# Patient Record
Sex: Female | Born: 1965 | Race: Black or African American | Hispanic: No | Marital: Married | Smoking: Never smoker
Health system: Southern US, Community
[De-identification: ages and names within clinical notes are randomized; demographics above are authoritative.]

## PROBLEM LIST (undated history)

## (undated) DIAGNOSIS — G4733 Obstructive sleep apnea (adult) (pediatric): Secondary | ICD-10-CM

## (undated) DIAGNOSIS — F32A Depression, unspecified: Secondary | ICD-10-CM

## (undated) DIAGNOSIS — E785 Hyperlipidemia, unspecified: Secondary | ICD-10-CM

## (undated) DIAGNOSIS — Z9989 Dependence on other enabling machines and devices: Secondary | ICD-10-CM

## (undated) DIAGNOSIS — I10 Essential (primary) hypertension: Secondary | ICD-10-CM

## (undated) DIAGNOSIS — T7840XA Allergy, unspecified, initial encounter: Secondary | ICD-10-CM

## (undated) DIAGNOSIS — R0683 Snoring: Principal | ICD-10-CM

## (undated) DIAGNOSIS — G4730 Sleep apnea, unspecified: Secondary | ICD-10-CM

## (undated) HISTORY — DX: Snoring: R06.83

## (undated) HISTORY — DX: Allergy, unspecified, initial encounter: T78.40XA

## (undated) HISTORY — DX: Obstructive sleep apnea (adult) (pediatric): G47.33

## (undated) HISTORY — DX: Depression, unspecified: F32.A

## (undated) HISTORY — PX: OTHER SURGICAL HISTORY: SHX169

## (undated) HISTORY — PX: DIAGNOSTIC LAPAROSCOPY: SUR761

## (undated) HISTORY — DX: Dependence on other enabling machines and devices: Z99.89

## (undated) HISTORY — DX: Sleep apnea, unspecified: G47.30

## (undated) HISTORY — DX: Hyperlipidemia, unspecified: E78.5

---

## 1993-10-28 HISTORY — PX: CHOLECYSTECTOMY: SHX55

## 2002-09-29 ENCOUNTER — Encounter: Admission: RE | Admit: 2002-09-29 | Discharge: 2002-09-29 | Payer: Self-pay | Admitting: Internal Medicine

## 2002-09-29 ENCOUNTER — Encounter: Payer: Self-pay | Admitting: Internal Medicine

## 2002-11-02 ENCOUNTER — Encounter: Admission: RE | Admit: 2002-11-02 | Discharge: 2002-11-02 | Payer: Self-pay | Admitting: Obstetrics and Gynecology

## 2002-11-02 ENCOUNTER — Encounter: Payer: Self-pay | Admitting: Obstetrics and Gynecology

## 2005-04-25 ENCOUNTER — Emergency Department (HOSPITAL_COMMUNITY): Admission: EM | Admit: 2005-04-25 | Discharge: 2005-04-26 | Payer: Self-pay | Admitting: Emergency Medicine

## 2005-12-23 ENCOUNTER — Other Ambulatory Visit: Admission: RE | Admit: 2005-12-23 | Discharge: 2005-12-23 | Payer: Self-pay | Admitting: Obstetrics and Gynecology

## 2006-02-11 ENCOUNTER — Encounter: Admission: RE | Admit: 2006-02-11 | Discharge: 2006-02-11 | Payer: Self-pay | Admitting: Obstetrics and Gynecology

## 2007-02-13 ENCOUNTER — Encounter: Admission: RE | Admit: 2007-02-13 | Discharge: 2007-02-13 | Payer: Self-pay | Admitting: Obstetrics and Gynecology

## 2008-03-10 ENCOUNTER — Encounter: Admission: RE | Admit: 2008-03-10 | Discharge: 2008-03-10 | Payer: Self-pay | Admitting: Obstetrics and Gynecology

## 2009-03-20 ENCOUNTER — Encounter: Admission: RE | Admit: 2009-03-20 | Discharge: 2009-03-20 | Payer: Self-pay | Admitting: Obstetrics and Gynecology

## 2009-03-23 ENCOUNTER — Encounter: Admission: RE | Admit: 2009-03-23 | Discharge: 2009-03-23 | Payer: Self-pay | Admitting: Obstetrics and Gynecology

## 2009-09-05 ENCOUNTER — Encounter: Admission: RE | Admit: 2009-09-05 | Discharge: 2009-09-05 | Payer: Self-pay | Admitting: Obstetrics and Gynecology

## 2010-03-27 ENCOUNTER — Encounter: Admission: RE | Admit: 2010-03-27 | Discharge: 2010-03-27 | Payer: Self-pay | Admitting: Obstetrics and Gynecology

## 2010-11-18 ENCOUNTER — Encounter: Payer: Self-pay | Admitting: Obstetrics and Gynecology

## 2011-05-07 ENCOUNTER — Other Ambulatory Visit: Payer: Self-pay | Admitting: Obstetrics and Gynecology

## 2011-05-07 DIAGNOSIS — Z1231 Encounter for screening mammogram for malignant neoplasm of breast: Secondary | ICD-10-CM

## 2011-05-14 ENCOUNTER — Ambulatory Visit
Admission: RE | Admit: 2011-05-14 | Discharge: 2011-05-14 | Disposition: A | Discharge status: Home / Self Care | Payer: BC Managed Care – PPO | Source: Ambulatory Visit | Attending: Obstetrics and Gynecology | Admitting: Obstetrics and Gynecology

## 2011-05-14 DIAGNOSIS — Z1231 Encounter for screening mammogram for malignant neoplasm of breast: Secondary | ICD-10-CM

## 2012-05-08 ENCOUNTER — Other Ambulatory Visit: Payer: Self-pay | Admitting: Obstetrics and Gynecology

## 2012-05-08 DIAGNOSIS — Z1231 Encounter for screening mammogram for malignant neoplasm of breast: Secondary | ICD-10-CM

## 2012-05-25 ENCOUNTER — Ambulatory Visit
Admission: RE | Admit: 2012-05-25 | Discharge: 2012-05-25 | Disposition: A | Discharge status: Home / Self Care | Payer: Managed Care, Other (non HMO) | Source: Ambulatory Visit | Attending: Obstetrics and Gynecology | Admitting: Obstetrics and Gynecology

## 2012-05-25 DIAGNOSIS — Z1231 Encounter for screening mammogram for malignant neoplasm of breast: Secondary | ICD-10-CM

## 2012-05-27 ENCOUNTER — Encounter (HOSPITAL_BASED_OUTPATIENT_CLINIC_OR_DEPARTMENT_OTHER): Payer: Self-pay | Admitting: *Deleted

## 2012-05-27 NOTE — Progress Notes (Signed)
To come in for bmet-ekg  

## 2012-05-28 ENCOUNTER — Other Ambulatory Visit: Payer: Self-pay | Admitting: Obstetrics and Gynecology

## 2012-05-28 ENCOUNTER — Encounter (HOSPITAL_BASED_OUTPATIENT_CLINIC_OR_DEPARTMENT_OTHER)
Admission: RE | Admit: 2012-05-28 | Discharge: 2012-05-28 | Disposition: A | Discharge status: Home / Self Care | Payer: Managed Care, Other (non HMO) | Source: Ambulatory Visit | Attending: Orthopedic Surgery | Admitting: Orthopedic Surgery

## 2012-05-28 DIAGNOSIS — R928 Other abnormal and inconclusive findings on diagnostic imaging of breast: Secondary | ICD-10-CM

## 2012-05-28 LAB — BASIC METABOLIC PANEL
BUN: 10 mg/dL (ref 6–23)
CO2: 30 mEq/L (ref 19–32)
Calcium: 9.3 mg/dL (ref 8.4–10.5)
Chloride: 101 mEq/L (ref 96–112)
Creatinine, Ser: 0.72 mg/dL (ref 0.50–1.10)
GFR calc Af Amer: >90 mL/min (ref >90)
GFR calc non Af Amer: >90 mL/min (ref >90)

## 2012-05-28 NOTE — Progress Notes (Signed)
ekg cleared by dr Jean Rosenthal compare with old ekg no change

## 2012-05-29 ENCOUNTER — Other Ambulatory Visit: Payer: Self-pay | Admitting: Orthopedic Surgery

## 2012-06-02 ENCOUNTER — Encounter (HOSPITAL_BASED_OUTPATIENT_CLINIC_OR_DEPARTMENT_OTHER)
Admission: RE | Disposition: A | Discharge status: Home / Self Care | Payer: Self-pay | Source: Ambulatory Visit | Attending: Orthopedic Surgery

## 2012-06-02 ENCOUNTER — Encounter (HOSPITAL_BASED_OUTPATIENT_CLINIC_OR_DEPARTMENT_OTHER): Payer: Self-pay | Admitting: Certified Registered Nurse Anesthetist

## 2012-06-02 ENCOUNTER — Encounter (HOSPITAL_BASED_OUTPATIENT_CLINIC_OR_DEPARTMENT_OTHER): Payer: Self-pay | Admitting: Orthopedic Surgery

## 2012-06-02 ENCOUNTER — Ambulatory Visit (HOSPITAL_BASED_OUTPATIENT_CLINIC_OR_DEPARTMENT_OTHER): Payer: Managed Care, Other (non HMO) | Admitting: Certified Registered Nurse Anesthetist

## 2012-06-02 ENCOUNTER — Ambulatory Visit (HOSPITAL_BASED_OUTPATIENT_CLINIC_OR_DEPARTMENT_OTHER)
Admission: RE | Admit: 2012-06-02 | Discharge: 2012-06-02 | Disposition: A | Discharge status: Home / Self Care | Payer: Managed Care, Other (non HMO) | Source: Ambulatory Visit | Attending: Orthopedic Surgery | Admitting: Orthopedic Surgery

## 2012-06-02 ENCOUNTER — Encounter (HOSPITAL_BASED_OUTPATIENT_CLINIC_OR_DEPARTMENT_OTHER): Payer: Self-pay

## 2012-06-02 DIAGNOSIS — Z01810 Encounter for preprocedural cardiovascular examination: Secondary | ICD-10-CM | POA: Insufficient documentation

## 2012-06-02 DIAGNOSIS — R229 Localized swelling, mass and lump, unspecified: Secondary | ICD-10-CM | POA: Insufficient documentation

## 2012-06-02 DIAGNOSIS — I10 Essential (primary) hypertension: Secondary | ICD-10-CM | POA: Insufficient documentation

## 2012-06-02 DIAGNOSIS — Z01812 Encounter for preprocedural laboratory examination: Secondary | ICD-10-CM | POA: Insufficient documentation

## 2012-06-02 HISTORY — PX: MASS EXCISION: SHX2000

## 2012-06-02 HISTORY — DX: Essential (primary) hypertension: I10

## 2012-06-02 LAB — POCT HEMOGLOBIN-HEMACUE: Hemoglobin: 13.8 g/dL (ref 12.0–15.0)

## 2012-06-02 SURGERY — EXCISION MASS
Anesthesia: Monitor Anesthesia Care | Site: Thumb | Laterality: Left | Wound class: Clean

## 2012-06-02 MED ORDER — CEFAZOLIN SODIUM 1-5 GM-% IV SOLN
INTRAVENOUS | Status: DC | PRN
  Administered 2012-06-02: 2 g via INTRAVENOUS

## 2012-06-02 MED ORDER — LIDOCAINE HCL (CARDIAC) 20 MG/ML IV SOLN
INTRAVENOUS | Status: DC | PRN
  Administered 2012-06-02: 30 mg via INTRAVENOUS

## 2012-06-02 MED ORDER — DEXAMETHASONE SODIUM PHOSPHATE 10 MG/ML IJ SOLN
INTRAMUSCULAR | Status: DC | PRN
  Administered 2012-06-02: 4 mg via INTRAVENOUS

## 2012-06-02 MED ORDER — HYDROCODONE-ACETAMINOPHEN 5-500 MG PO TABS: 1.0000 | ORAL_TABLET | ORAL | Status: AC | PRN

## 2012-06-02 MED ORDER — HYDROMORPHONE HCL PF 1 MG/ML IJ SOLN: 0.2500 mg | INTRAMUSCULAR | Status: DC | PRN

## 2012-06-02 MED ORDER — OXYCODONE HCL 5 MG/5ML PO SOLN: 5.0000 mg | Freq: Once | ORAL | Status: DC | PRN

## 2012-06-02 MED ORDER — METOCLOPRAMIDE HCL 5 MG/ML IJ SOLN: 10.0000 mg | Freq: Once | INTRAMUSCULAR | Status: DC | PRN

## 2012-06-02 MED ORDER — CHLORHEXIDINE GLUCONATE 4 % EX LIQD: 60.0000 mL | Freq: Once | CUTANEOUS | Status: DC

## 2012-06-02 MED ORDER — OXYCODONE HCL 5 MG PO TABS: 5.0000 mg | ORAL_TABLET | Freq: Once | ORAL | Status: DC | PRN

## 2012-06-02 MED ORDER — PROPOFOL 10 MG/ML IV EMUL
INTRAVENOUS | Status: DC | PRN
  Administered 2012-06-02: 75 ug/kg/min via INTRAVENOUS

## 2012-06-02 MED ORDER — LIDOCAINE HCL (PF) 0.5 % IJ SOLN
INTRAMUSCULAR | Status: DC | PRN
  Administered 2012-06-02: 30 mL via INTRATHECAL

## 2012-06-02 MED ORDER — BUPIVACAINE HCL (PF) 0.25 % IJ SOLN
INTRAMUSCULAR | Status: DC | PRN
  Administered 2012-06-02: 5 mL

## 2012-06-02 MED ORDER — LACTATED RINGERS IV SOLN
INTRAVENOUS | Status: DC
  Administered 2012-06-02: 07:00:00 via INTRAVENOUS

## 2012-06-02 MED ORDER — MIDAZOLAM HCL 5 MG/5ML IJ SOLN
INTRAMUSCULAR | Status: DC | PRN
  Administered 2012-06-02: 1 mg via INTRAVENOUS

## 2012-06-02 MED ORDER — FENTANYL CITRATE 0.05 MG/ML IJ SOLN
INTRAMUSCULAR | Status: DC | PRN
  Administered 2012-06-02: 50 ug via INTRAVENOUS

## 2012-06-02 MED ORDER — 0.9 % SODIUM CHLORIDE (POUR BTL) OPTIME
TOPICAL | Status: DC | PRN
  Administered 2012-06-02: 1000 mL

## 2012-06-02 SURGICAL SUPPLY — 49 items
BANDAGE COBAN STERILE 2 (GAUZE/BANDAGES/DRESSINGS) IMPLANT
BANDAGE GAUZE ELAST BULKY 4 IN (GAUZE/BANDAGES/DRESSINGS) IMPLANT
BLADE MINI RND TIP GREEN BEAV (BLADE) IMPLANT
BLADE SURG 15 STRL LF DISP TIS (BLADE) ×1 IMPLANT
BLADE SURG 15 STRL SS (BLADE) ×2
BNDG CMPR 9X4 STRL LF SNTH (GAUZE/BANDAGES/DRESSINGS) ×1
BNDG COHESIVE 1X5 TAN STRL LF (GAUZE/BANDAGES/DRESSINGS) ×1 IMPLANT
BNDG COHESIVE 3X5 TAN STRL LF (GAUZE/BANDAGES/DRESSINGS) IMPLANT
BNDG ESMARK 4X9 LF (GAUZE/BANDAGES/DRESSINGS) ×1 IMPLANT
CHLORAPREP W/TINT 26ML (MISCELLANEOUS) ×2 IMPLANT
CLOTH BEACON ORANGE TIMEOUT ST (SAFETY) ×2 IMPLANT
CORDS BIPOLAR (ELECTRODE) ×2 IMPLANT
COVER MAYO STAND STRL (DRAPES) ×2 IMPLANT
COVER TABLE BACK 60X90 (DRAPES) ×2 IMPLANT
CUFF TOURNIQUET SINGLE 18IN (TOURNIQUET CUFF) ×1 IMPLANT
DECANTER SPIKE VIAL GLASS SM (MISCELLANEOUS) IMPLANT
DRAIN PENROSE 1/2X12 LTX STRL (WOUND CARE) IMPLANT
DRAPE EXTREMITY T 121X128X90 (DRAPE) ×2 IMPLANT
DRAPE SURG 17X23 STRL (DRAPES) ×2 IMPLANT
GAUZE XEROFORM 1X8 LF (GAUZE/BANDAGES/DRESSINGS) ×2 IMPLANT
GLOVE BIO SURGEON STRL SZ 6.5 (GLOVE) ×2 IMPLANT
GLOVE BIOGEL PI IND STRL 7.0 (GLOVE) IMPLANT
GLOVE BIOGEL PI INDICATOR 7.0 (GLOVE) ×1
GLOVE SURG ORTHO 8.0 STRL STRW (GLOVE) ×2 IMPLANT
GOWN BRE IMP PREV XXLGXLNG (GOWN DISPOSABLE) ×3 IMPLANT
GOWN PREVENTION PLUS XLARGE (GOWN DISPOSABLE) ×2 IMPLANT
NDL SAFETY ECLIPSE 18X1.5 (NEEDLE) ×1 IMPLANT
NEEDLE 27GAX1X1/2 (NEEDLE) ×1 IMPLANT
NEEDLE HYPO 18GX1.5 SHARP (NEEDLE)
NS IRRIG 1000ML POUR BTL (IV SOLUTION) ×2 IMPLANT
PACK BASIN DAY SURGERY FS (CUSTOM PROCEDURE TRAY) ×2 IMPLANT
PAD CAST 3X4 CTTN HI CHSV (CAST SUPPLIES) IMPLANT
PADDING CAST ABS 3INX4YD NS (CAST SUPPLIES)
PADDING CAST ABS 4INX4YD NS (CAST SUPPLIES) ×1
PADDING CAST ABS COTTON 3X4 (CAST SUPPLIES) IMPLANT
PADDING CAST ABS COTTON 4X4 ST (CAST SUPPLIES) ×1 IMPLANT
PADDING CAST COTTON 3X4 STRL (CAST SUPPLIES)
SPLINT PLASTER CAST XFAST 3X15 (CAST SUPPLIES) IMPLANT
SPLINT PLASTER XTRA FASTSET 3X (CAST SUPPLIES)
SPONGE GAUZE 4X4 12PLY (GAUZE/BANDAGES/DRESSINGS) ×2 IMPLANT
STOCKINETTE 4X48 STRL (DRAPES) ×2 IMPLANT
SUT VIC AB 4-0 P2 18 (SUTURE) IMPLANT
SUT VICRYL RAPID 5 0 P 3 (SUTURE) IMPLANT
SUT VICRYL RAPIDE 4/0 PS 2 (SUTURE) ×2 IMPLANT
SYR BULB 3OZ (MISCELLANEOUS) ×2 IMPLANT
SYR CONTROL 10ML LL (SYRINGE) ×1 IMPLANT
TOWEL OR 17X24 6PK STRL BLUE (TOWEL DISPOSABLE) ×3 IMPLANT
UNDERPAD 30X30 INCONTINENT (UNDERPADS AND DIAPERS) ×2 IMPLANT
WATER STERILE IRR 1000ML POUR (IV SOLUTION) ×1 IMPLANT

## 2012-06-02 NOTE — Brief Op Note (Signed)
06/02/2012  9:11 AM  PATIENT:  Marie Rocha  46 y.o. female  PRE-OPERATIVE DIAGNOSIS:  Mass left thumb  POST-OPERATIVE DIAGNOSIS:  Mass left thumb  PROCEDURE:  Procedure(s) (LRB): EXCISION MASS (Left)  SURGEON:  Surgeon(s) and Role:    * Nicki Reaper, MD - Primary  PHYSICIAN ASSISTANT:   ASSISTANTS: none   ANESTHESIA:   local and regional  EBL:     BLOOD ADMINISTERED:none  DRAINS: none   LOCAL MEDICATIONS USED:  MARCAINE     SPECIMEN:  Excision  DISPOSITION OF SPECIMEN:  PATHOLOGY  COUNTS:  YES  TOURNIQUET:   Total Tourniquet Time Documented: Forearm (Left) - 19 minutes  DICTATION: .Other Dictation: Dictation Number [id number]  PLAN OF CARE: Discharge to home after PACU  PATIENT DISPOSITION:  PACU - hemodynamically stable.

## 2012-06-02 NOTE — H&P (Signed)
  [name] is a 46 year-old right-hand dominant female referred by Dr. Eloise Harman for consultation with respect to a mass on her left thumb radial aspect left side just distal to the metacarpophalangeal joint. She recalls no specific history of injury.  It does not bother her at the present time.  She complains only of a mild dull feeling of swelling. She states it has gotten somewhat better.  She has no history of diabetes, thyroid problems, arthritis or gout.    ALLERGIES:    None.  MEDICATIONS:      Amlodipine.  SURGICAL HISTORY:      Cholecystectomy.  FAMILY MEDICAL HISTORY:  Positive for high blood pressure, otherwise negative.   SOCIAL HISTORY:     She does not smoke or drink.   REVIEW OF SYSTEMS:    Positive for glasses, high blood pressure, otherwise negative 14 points. Marie Rocha is an 46 y.o. female.   Chief Complaint: mass left thumb HPI: see above  Past Medical History  Diagnosis Date  . Hypertension     Past Surgical History  Procedure Date  . Cholecystectomy 1995    lap choli  . Diagnostic laparoscopy     exploratory-    History reviewed. No pertinent family history. Social History:  reports that she has never smoked. She does not have any smokeless tobacco history on file. Her alcohol and drug histories not on file.  Allergies: No Known Allergies  Medications Prior to Admission  Medication Sig Dispense Refill  . amLODipine (NORVASC) 5 MG tablet Take 5 mg by mouth daily.        Results for orders placed during the hospital encounter of 06/02/12 (from the past 48 hour(s))  POCT HEMOGLOBIN-HEMACUE     Status: Normal   Collection Time   06/02/12  7:29 AM      Component Value Range Comment   Hemoglobin 13.8  12.0 - 15.0 g/dL     No results found.   Pertinent items are noted in HPI.  Blood pressure 127/86, pulse 63, temperature 97.9 F (36.6 C), temperature source Oral, resp. rate 18, height 5\' 7"  (1.702 m), weight 225 lb (102.059 kg), SpO2  97.00%.  General appearance: alert, cooperative and appears stated age Head: Normocephalic, without obvious abnormality Neck: no adenopathy Resp: clear to auscultation bilaterally Cardio: regular rate and rhythm, S1, S2 normal, no murmur, click, rub or gallop GI: soft, non-tender; bowel sounds normal; no masses,  no organomegaly Extremities: extremities normal, atraumatic, no cyanosis or edema Pulses: 2+ and symmetric Skin: Skin color, texture, turgor normal. No rashes or lesions Neurologic: Grossly normal Incision/Wound: na  Assessment/Plan DIAGNOSIS:      Soft tissue tumor unspecified.    RECOMMENDATIONS/PLAN:    This may be a giant cell tumor, foreign body granuloma, epidermal inclusion cyst, could be a history of gout.  There is always potential this could be a vascular tumor although it is nonpulsatile.  There is possibility a nerve tumor also exists despite this having a negative Tinel's.  We have discussed possibility of surgical excision of this. She is desirous of having this done.  She is scheduled for excision mass left thumb as an outpatient.    Jenniferann Stuckert R 06/02/2012, 8:34 AM

## 2012-06-02 NOTE — Op Note (Signed)
Dictated number:[id number]

## 2012-06-02 NOTE — Transfer of Care (Signed)
Immediate Anesthesia Transfer of Care Note  Patient: Marie Rocha  Procedure(s) Performed: Procedure(s) (LRB): EXCISION MASS (Left)  Patient Location: PACU  Anesthesia Type: Regional  Level of Consciousness: awake and alert   Airway & Oxygen Therapy: Patient Spontanous Breathing and Patient connected to face mask oxygen  Post-op Assessment: Report given to PACU RN and Post -op Vital signs reviewed and stable  Post vital signs: Reviewed and stable  Complications: No apparent anesthesia complications

## 2012-06-02 NOTE — Anesthesia Preprocedure Evaluation (Signed)
Anesthesia Evaluation  Patient identified by MRN, date of birth, ID band Patient awake    Reviewed: Allergy & Precautions, H&P , NPO status , Patient's Chart, lab work & pertinent test results, reviewed documented beta blocker date and time   Airway Mallampati: II TM Distance: >3 FB Neck ROM: full    Dental   Pulmonary neg pulmonary ROS,  breath sounds clear to auscultation        Cardiovascular hypertension, On Medications Rhythm:regular     Neuro/Psych negative neurological ROS  negative psych ROS   GI/Hepatic negative GI ROS, Neg liver ROS,   Endo/Other  negative endocrine ROS  Renal/GU negative Renal ROS  negative genitourinary   Musculoskeletal   Abdominal   Peds  Hematology negative hematology ROS (+)   Anesthesia Other Findings See surgeon's H&P   Reproductive/Obstetrics negative OB ROS                           Anesthesia Physical Anesthesia Plan  ASA: III  Anesthesia Plan: MAC and Bier Block   Post-op Pain Management:    Induction: Intravenous  Airway Management Planned: Simple Face Mask  Additional Equipment:   Intra-op Plan:   Post-operative Plan:   Informed Consent: I have reviewed the patients History and Physical, chart, labs and discussed the procedure including the risks, benefits and alternatives for the proposed anesthesia with the patient or authorized representative who has indicated his/her understanding and acceptance.   Dental Advisory Given  Plan Discussed with: CRNA and Surgeon  Anesthesia Plan Comments:         Anesthesia Quick Evaluation

## 2012-06-02 NOTE — Anesthesia Procedure Notes (Signed)
Procedure Name: MAC Date/Time: 06/02/2012 8:41 AM Performed by: Caleesi Kohl D Pre-anesthesia Checklist: Patient identified, Emergency Drugs available, Suction available, Patient being monitored and Timeout performed Patient Re-evaluated:Patient Re-evaluated prior to inductionOxygen Delivery Method: Simple face mask

## 2012-06-02 NOTE — Anesthesia Postprocedure Evaluation (Signed)
Anesthesia Post Note  Patient: [name]  Procedure(s) Performed: Procedure(s) (LRB): EXCISION MASS (Left)  Anesthesia type: MAC  Patient location: PACU  Post pain: Pain level controlled  Post assessment: Patient's Cardiovascular Status Stable  Last Vitals:  Filed Vitals:   06/02/12 0940  BP: 149/85  Pulse: 54  Temp: 36.6 C  Resp: 16    Post vital signs: Reviewed and stable  Level of consciousness: alert  Complications: No apparent anesthesia complications

## 2012-06-03 ENCOUNTER — Encounter (HOSPITAL_BASED_OUTPATIENT_CLINIC_OR_DEPARTMENT_OTHER): Payer: Self-pay | Admitting: Orthopedic Surgery

## 2012-06-03 NOTE — Op Note (Signed)
NAMEZACHARY, Marie Rocha.:  [id number]  MEDICAL RECORD NO.:  [id number]  LOCATION:                                 FACILITY:  PHYSICIAN:  Cindee Salt, M.D.            DATE OF BIRTH:  DATE OF PROCEDURE:  06/02/2012 DATE OF DISCHARGE:                              OPERATIVE REPORT   PREOPERATIVE DIAGNOSIS:  Mass left thumb.  POSTOPERATIVE DIAGNOSIS:  Mass left thumb.  OPERATION:  Excisional biopsy mass left thumb.  SURGEON:  Cindee Salt, MD  ANESTHESIA:  Forearm-based IV regional with local infiltration.  ANESTHESIOLOGIST:  Janetta Hora. Gelene Mink, MD  HISTORY:  The patient is a 46 year old female with history of a mass on the thenar eminence of her left thumb.  This has enlarged.  She is concerned.  She is desirous having it removed.  She is aware that there is no guarantee with the surgery, possibility of infection, recurrence of injury to arteries, nerves, tendons, incomplete relief of symptoms, dystrophy.  It is difficult to determine exactly what this is and that it does not transilluminate and appears to be solid.  In the preoperative area, the patient is seen, the extremity marked by both the patient and surgeon.  PROCEDURE:  The patient was brought to the operating room where a forearm-based IV regional anesthetic was carried out without difficulty. She was prepped using ChloraPrep, supine position, left arm free.  A 3- minute dry time was allowed.  Time-out taken, confirming the patient and procedure.  After adequate anesthesia was afforded, a longitudinal incision was made.  This was over the interval between the dorsal palmar, skin carried down through subcutaneous tissue.  The mass was immediately encountered.  This was multilobulated.  A purplish grey solid mass.  Neurovascular structures were identified, protected, blood vessels entering into the mass were coagulated with bipolar, and the entire mass was excised in total and sent to Pathology.   This was well encapsulated, was firm the wound was copiously irrigated with saline. The skin was then closed with subcuticular 4-0 Vicryl Rapide suture and local infiltration with 0.25% Marcaine without epinephrine was given, approximately 5 mL was used.  Sterile compressive dressing was applied. Deflation of the tourniquet, all fingers immediately pinked.  She was taken to the recovery room for observation in satisfactory condition.         ______________________________ Cindee Salt, M.D.    GK/MEDQ  D:  06/02/2012  T:  06/03/2012  Job:  [id number]

## 2012-06-04 ENCOUNTER — Ambulatory Visit
Admission: RE | Admit: 2012-06-04 | Discharge: 2012-06-04 | Disposition: A | Discharge status: Home / Self Care | Payer: Managed Care, Other (non HMO) | Source: Ambulatory Visit | Attending: Obstetrics and Gynecology | Admitting: Obstetrics and Gynecology

## 2012-06-04 ENCOUNTER — Other Ambulatory Visit: Payer: Self-pay | Admitting: Obstetrics and Gynecology

## 2012-06-04 DIAGNOSIS — R928 Other abnormal and inconclusive findings on diagnostic imaging of breast: Secondary | ICD-10-CM

## 2012-06-11 ENCOUNTER — Inpatient Hospital Stay: Admission: RE | Admit: 2012-06-11 | Payer: Managed Care, Other (non HMO) | Source: Ambulatory Visit

## 2012-06-24 ENCOUNTER — Other Ambulatory Visit: Payer: Self-pay | Admitting: Obstetrics and Gynecology

## 2012-06-24 ENCOUNTER — Ambulatory Visit
Admission: RE | Admit: 2012-06-24 | Discharge: 2012-06-24 | Disposition: A | Discharge status: Home / Self Care | Payer: Managed Care, Other (non HMO) | Source: Ambulatory Visit | Attending: Obstetrics and Gynecology | Admitting: Obstetrics and Gynecology

## 2012-06-24 DIAGNOSIS — R928 Other abnormal and inconclusive findings on diagnostic imaging of breast: Secondary | ICD-10-CM

## 2012-12-19 IMAGING — MG MM DIAGNOSTIC UNILATERAL R
2 series · 2 of 2 positions shown · non-contrast
Comparison: Previous exams.

CLINICAL DATA: Ultrasound-guided core needle biopsy of a nodule at
3 o'clock 2 cm from the right nipple with clip placement.

DIGITAL DIAGNOSTIC RIGHT MAMMOGRAM

[R CC]
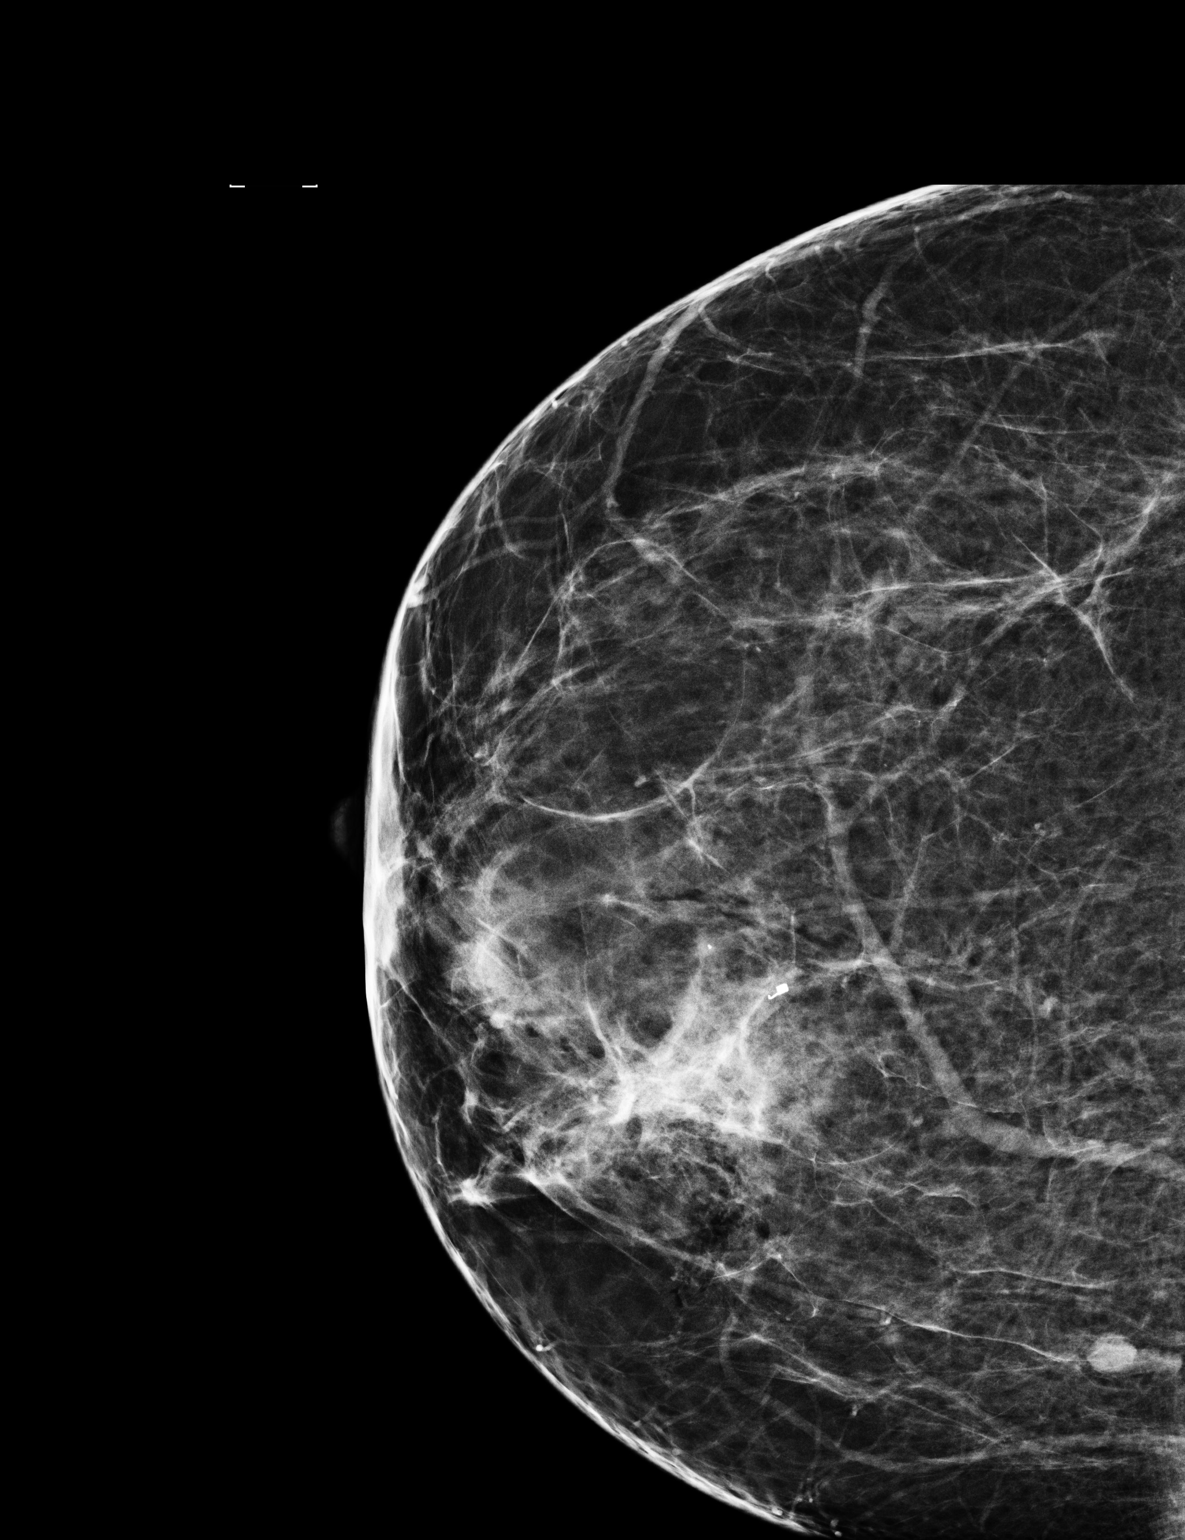

[R ML]
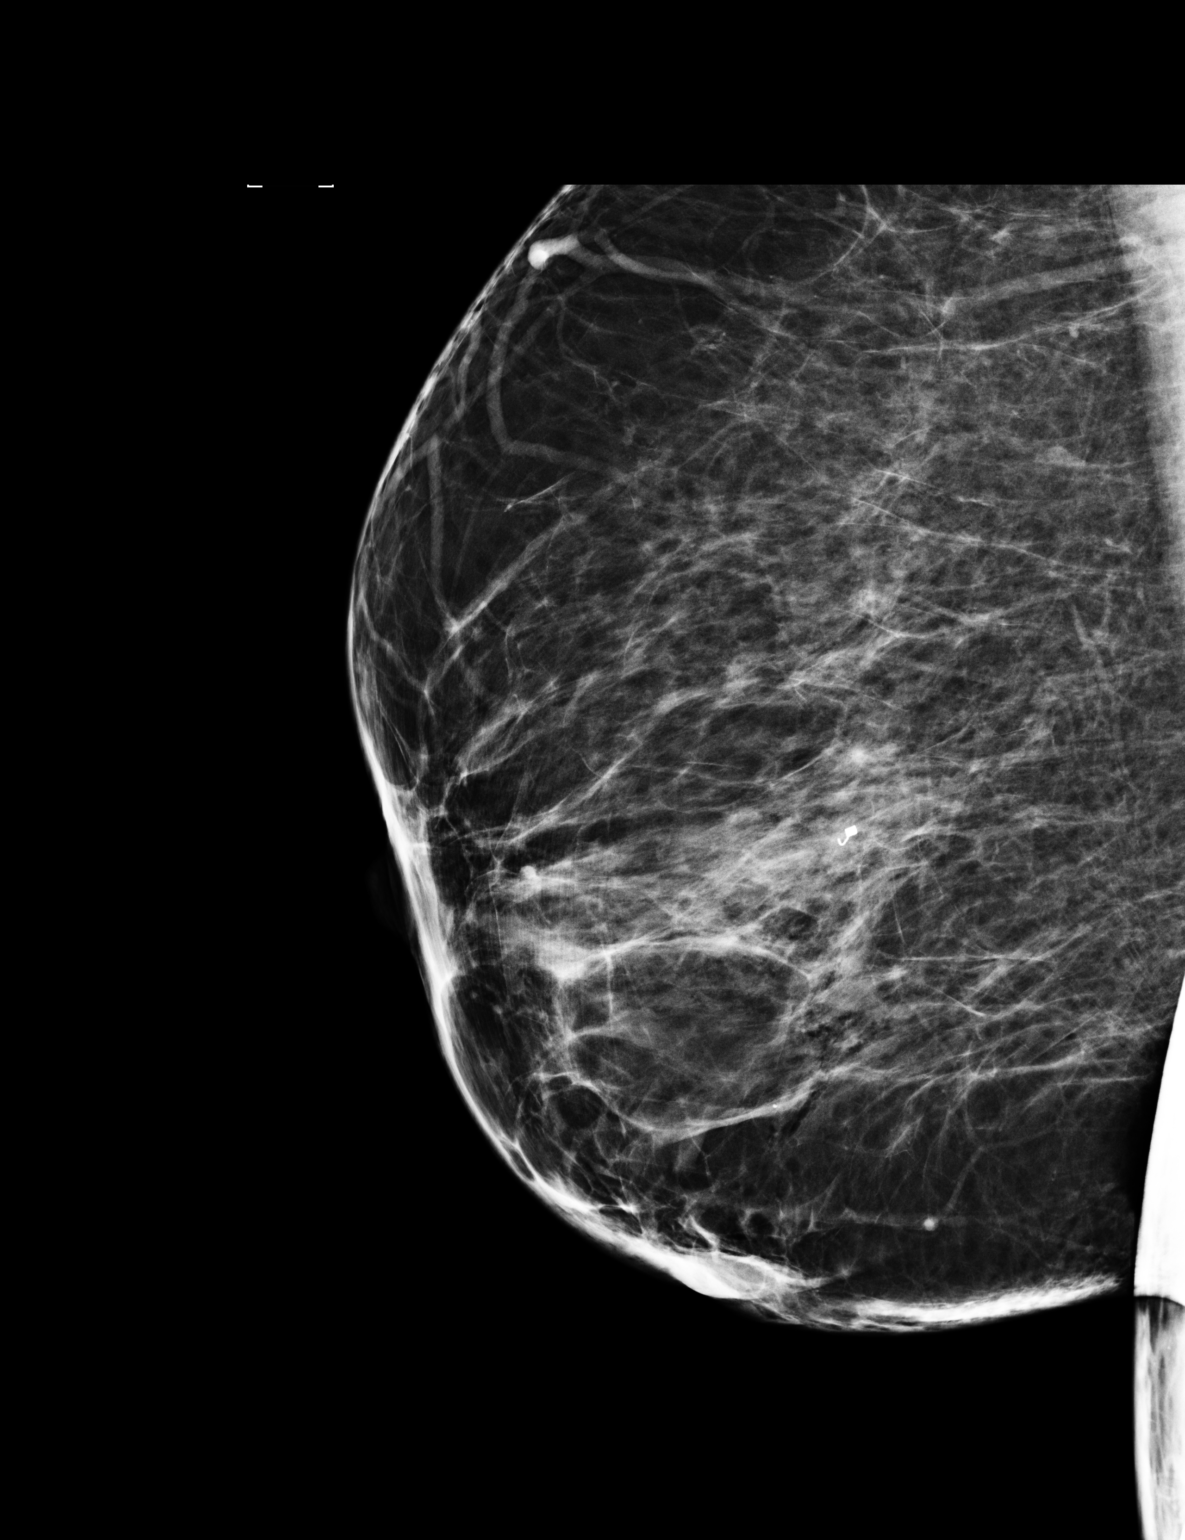

[2 of 2 positions shown; findings below may reference images not displayed]

FINDINGS: Films are performed following ultrasound guided biopsy
of a nodule at 3 o'clock 2 cm from the right nipple.  The coil clip
is approximately 12 mm medial to the location of the mass in the CC
projection but appropriately located in the MLO projection.
IMPRESSION: Clip placement as above.

## 2013-05-12 ENCOUNTER — Other Ambulatory Visit: Payer: Self-pay

## 2013-05-12 DIAGNOSIS — Z1231 Encounter for screening mammogram for malignant neoplasm of breast: Secondary | ICD-10-CM

## 2013-05-28 ENCOUNTER — Ambulatory Visit
Admission: RE | Admit: 2013-05-28 | Discharge: 2013-05-28 | Disposition: A | Discharge status: Home / Self Care | Payer: Managed Care, Other (non HMO) | Source: Ambulatory Visit

## 2013-05-28 DIAGNOSIS — Z1231 Encounter for screening mammogram for malignant neoplasm of breast: Secondary | ICD-10-CM

## 2013-06-01 ENCOUNTER — Other Ambulatory Visit: Payer: Self-pay | Admitting: Obstetrics and Gynecology

## 2013-06-01 DIAGNOSIS — R928 Other abnormal and inconclusive findings on diagnostic imaging of breast: Secondary | ICD-10-CM

## 2013-06-16 ENCOUNTER — Other Ambulatory Visit: Payer: Managed Care, Other (non HMO)

## 2013-07-02 ENCOUNTER — Ambulatory Visit
Admission: RE | Admit: 2013-07-02 | Discharge: 2013-07-02 | Disposition: A | Discharge status: Home / Self Care | Payer: Managed Care, Other (non HMO) | Source: Ambulatory Visit | Attending: Obstetrics and Gynecology | Admitting: Obstetrics and Gynecology

## 2013-07-02 DIAGNOSIS — R928 Other abnormal and inconclusive findings on diagnostic imaging of breast: Secondary | ICD-10-CM

## 2014-01-05 ENCOUNTER — Encounter: Payer: Self-pay | Admitting: Internal Medicine

## 2014-01-07 ENCOUNTER — Encounter: Payer: Self-pay | Admitting: Neurology

## 2014-02-04 ENCOUNTER — Institutional Professional Consult (permissible substitution): Payer: BC Managed Care – PPO | Admitting: Neurology

## 2014-02-22 ENCOUNTER — Encounter: Payer: Managed Care, Other (non HMO) | Admitting: Internal Medicine

## 2014-05-24 ENCOUNTER — Ambulatory Visit (AMBULATORY_SURGERY_CENTER): Payer: BC Managed Care – PPO | Admitting: *Deleted

## 2014-05-24 VITALS — Ht 67.5 in | Wt 239.8 lb

## 2014-05-24 DIAGNOSIS — Z1211 Encounter for screening for malignant neoplasm of colon: Secondary | ICD-10-CM

## 2014-05-24 MED ORDER — MOVIPREP 100 G PO SOLR: 1.0000 | Freq: Once | ORAL | Status: DC

## 2014-05-24 NOTE — Addendum Note (Signed)
Addended by: Dayton Bailiff D on: 05/24/2014 11:29 AM   Modules accepted: Orders, Medications

## 2014-05-24 NOTE — Progress Notes (Signed)
No egg or soy allergy. No anesthesia problems.  No home O2.   Diet medications Naltrexone-Bupropion.

## 2014-05-31 ENCOUNTER — Encounter: Payer: Self-pay | Admitting: Internal Medicine

## 2014-06-02 ENCOUNTER — Ambulatory Visit (AMBULATORY_SURGERY_CENTER): Payer: BC Managed Care – PPO | Admitting: Internal Medicine

## 2014-06-02 ENCOUNTER — Encounter: Payer: Self-pay | Admitting: Internal Medicine

## 2014-06-02 VITALS — BP 150/86 | HR 60 | Temp 98.3°F | Resp 14 | Ht 67.5 in | Wt 239.0 lb

## 2014-06-02 DIAGNOSIS — D126 Benign neoplasm of colon, unspecified: Secondary | ICD-10-CM

## 2014-06-02 DIAGNOSIS — D122 Benign neoplasm of ascending colon: Secondary | ICD-10-CM

## 2014-06-02 DIAGNOSIS — Z1211 Encounter for screening for malignant neoplasm of colon: Secondary | ICD-10-CM

## 2014-06-02 MED ORDER — SODIUM CHLORIDE 0.9 % IV SOLN: 500.0000 mL | INTRAVENOUS | Status: DC

## 2014-06-02 NOTE — Progress Notes (Signed)
Called to room to assist during endoscopic procedure.  Patient ID and intended procedure confirmed with present staff. Received instructions for my participation in the procedure from the performing physician.  

## 2014-06-02 NOTE — Progress Notes (Signed)
Report to PACU, RN, vss, BBS= Clear.  

## 2014-06-02 NOTE — Op Note (Signed)
[facility]  [address] [place], [id number]   COLONOSCOPY PROCEDURE REPORT  PATIENT: Marie Rocha, Marie Rocha.  MR#: [id number] BIRTHDATE: 08-06-1966 , 48  yrs. old GENDER: Female ENDOSCOPIST: Eustace Quail, MD REFERRED BY:W.  Lutricia Feil, M.D. PROCEDURE DATE:  06/02/2014 PROCEDURE:   Colonoscopy with snare polypectomy x 1 First Screening Colonoscopy - Avg.  risk and is 50 yrs.  old or older Yes.  Prior Negative Screening - Now for repeat screening. N/A  History of Adenoma - Now for follow-up colonoscopy & has been > or = to 3 yrs.  N/A  Polyps Removed Today? Yes. ASA CLASS:   Class II INDICATIONS:average risk screening. MEDICATIONS: MAC sedation, administered by CRNA and propofol (Diprivan) 380mg  IV  DESCRIPTION OF PROCEDURE:   After the risks benefits and alternatives of the procedure were thoroughly explained, informed consent was obtained.  A digital rectal exam revealed no abnormalities of the rectum.   The [id number]  endoscope was introduced through the anus and advanced to the cecum, which was identified by both the appendix and ileocecal valve. No adverse events experienced.   The quality of the prep was excellent, using MoviPrep  The instrument was then slowly withdrawn as the colon was fully examined.  COLON FINDINGS: A diminutive polyp was found in the ascending colon. A polypectomy was performed with a cold snare.  The resection was complete and the polyp tissue was completely retrieved.   The colon was otherwise normal.  There was no diverticulosis, inflammation, other polyps or cancers unless previously stated.  Retroflexed views revealed no abnormalities. The time to cecum=2 minutes 26 seconds.  Withdrawal time=13 minutes 46 seconds.  The scope was withdrawn and the procedure completed. COMPLICATIONS: There were no complications.  ENDOSCOPIC IMPRESSION: 1.   Diminutive polyp was found in the ascending colon; polypectomy was performed  with a cold snare 2.   The colon was otherwise normal  RECOMMENDATIONS: 1. Repeat colonoscopy in 5 years if polyp adenomatous; otherwise 10 years   eSigned:  Eustace Quail, MD 06/02/2014 3:14 PM   cc: Janalyn Rouse, MD and The Patient

## 2014-06-02 NOTE — Patient Instructions (Signed)
Impressions/recommendations:  Polyp (handout given) High Fiber diet (handout given)  Repeat colonoscopy pending pathology results.  YOU HAD AN ENDOSCOPIC PROCEDURE TODAY AT [facility]: Refer to the procedure report that was given to you for any specific questions about what was found during the examination.  If the procedure report does not answer your questions, please call your gastroenterologist to clarify.  If you requested that your care partner not be given the details of your procedure findings, then the procedure report has been included in a sealed envelope for you to review at your convenience later.  YOU SHOULD EXPECT: Some feelings of bloating in the abdomen. Passage of more gas than usual.  Walking can help get rid of the air that was put into your GI tract during the procedure and reduce the bloating. If you had a lower endoscopy (such as a colonoscopy or flexible sigmoidoscopy) you may notice spotting of blood in your stool or on the toilet paper. If you underwent a bowel prep for your procedure, then you may not have a normal bowel movement for a few days.  DIET: Your first meal following the procedure should be a light meal and then it is ok to progress to your normal diet.  A half-sandwich or bowl of soup is an example of a good first meal.  Heavy or fried foods are harder to digest and may make you feel nauseous or bloated.  Likewise meals heavy in dairy and vegetables can cause extra gas to form and this can also increase the bloating.  Drink plenty of fluids but you should avoid alcoholic beverages for 24 hours.  ACTIVITY: Your care partner should take you home directly after the procedure.  You should plan to take it easy, moving slowly for the rest of the day.  You can resume normal activity the day after the procedure however you should NOT DRIVE or use heavy machinery for 24 hours (because of the sedation medicines used during the test).    SYMPTOMS TO REPORT  IMMEDIATELY: A gastroenterologist can be reached at any hour.  During normal business hours, 8:30 AM to 5:00 PM Monday through Friday, call [phone].  After hours and on weekends, please call the GI answering service at [phone] who will take a message and have the physician on call contact you.   Following lower endoscopy (colonoscopy or flexible sigmoidoscopy):  Excessive amounts of blood in the stool  Significant tenderness or worsening of abdominal pains  Swelling of the abdomen that is new, acute  Fever of 100F or higher   FOLLOW UP: If any biopsies were taken you will be contacted by phone or by letter within the next 1-3 weeks.  Call your gastroenterologist if you have not heard about the biopsies in 3 weeks.  Our staff will call the home number listed on your records the next business day following your procedure to check on you and address any questions or concerns that you may have at that time regarding the information given to you following your procedure. This is a courtesy call and so if there is no answer at the home number and we have not heard from you through the emergency physician on call, we will assume that you have returned to your regular daily activities without incident.  SIGNATURES/CONFIDENTIALITY: You and/or your care partner have signed paperwork which will be entered into your electronic medical record.  These signatures attest to the fact that that the information above on  your After Visit Summary has been reviewed and is understood.  Full responsibility of the confidentiality of this discharge information lies with you and/or your care-partner. 

## 2014-06-03 ENCOUNTER — Telehealth: Payer: Self-pay

## 2014-06-03 NOTE — Telephone Encounter (Signed)
Left a message at [phone] for the pt to call back if any questions or concerns. Maw

## 2014-06-14 ENCOUNTER — Encounter: Payer: Self-pay | Admitting: Internal Medicine

## 2014-12-02 ENCOUNTER — Encounter: Payer: Self-pay | Admitting: Neurology

## 2014-12-02 ENCOUNTER — Ambulatory Visit (INDEPENDENT_AMBULATORY_CARE_PROVIDER_SITE_OTHER): Payer: BLUE CROSS/BLUE SHIELD | Admitting: Neurology

## 2014-12-02 VITALS — BP 124/89 | HR 73 | Resp 16 | Ht 67.5 in | Wt 239.2 lb

## 2014-12-02 DIAGNOSIS — E662 Morbid (severe) obesity with alveolar hypoventilation: Secondary | ICD-10-CM

## 2014-12-02 DIAGNOSIS — G4763 Sleep related bruxism: Secondary | ICD-10-CM | POA: Insufficient documentation

## 2014-12-02 DIAGNOSIS — R0683 Snoring: Secondary | ICD-10-CM

## 2014-12-02 HISTORY — DX: Snoring: R06.83

## 2014-12-02 NOTE — Patient Instructions (Signed)
Polysomnography (Sleep Studies) Polysomnography (PSG) is a series of tests used for detecting (diagnosing) obstructive sleep apnea and other sleep disorders. The tests measure how some parts of your body are working while you are sleeping. The tests are extensive and expensive. They are done in a sleep lab or hospital, and vary from center to center. Your caregiver may perform other more simple sleep studies and questionnaires before doing more complete and involved testing. Testing may not be covered by insurance. Some of these tests are:  An EEG (Electroencephalogram). This tests your brain waves and stages of sleep.  An EOG (Electrooculogram). This measures the movements of your eyes. It detects periods of REM (rapid eye movement) sleep, which is your dream sleep.  An EKG (Electrocardiogram). This measures your heart rhythm.  EMG (Electromyography). This is a measurement of how the muscles are working in your upper airway and your legs while sleeping.  An oximetry measurement. It measures how much oxygen (air) you are getting while sleeping.  Breathing efforts may be measured. The same test can be interpreted (understood) differently by different caregivers and centers that study sleep.  Studies may be given an apnea/hypopnea index (AHI). This is a number which is found by counting the times of no breathing or under breathing during the night, and relating those numbers to the amount of time spent in bed. When the AHI is greater than 15, the patient is likely to complain of daytime sleepiness. When the AHI is greater than 30, the patient is at increased risk for heart problems and must be followed more closely. Following the AHI also allows you to know how treatment is working. Simple oximetry (tracking the amount of oxygen that is taken in) can be used for screening patients who:  Do not have symptoms (problems) of OSA.  Have a normal Epworth Sleepiness Scale Score.  Have a low pre-test  probability of having OSA.  Have none of the upper airway problems likely to cause apnea.  Oximetry is also used to determine if treatment is effective in patients who showed significant desaturations (not getting enough oxygen) on their home sleep study. One extra measure of safety is to perform additional studies for the person who only snores. This is because no one can predict with absolute certainty who will have OSA. Those who show significant desaturations (not getting enough oxygen) are recommended to have a more detailed sleep study. Document Released: 04/20/2003 Document Revised: 01/06/2012 Document Reviewed: 12/20/2013 [employer] Patient Information 2015 [employer]. This information is not intended to replace advice given to you by your health care provider. Make sure you discuss any questions you have with your health care provider.  

## 2014-12-02 NOTE — Progress Notes (Signed)
SLEEP MEDICINE CLINIC   Provider:  Larey Seat, M D  Referring Provider: Marton Redwood, MD Primary Care Physician:  Marton Redwood, MD  Chief Complaint  Patient presents with  . [facility]  Sleep Consult    Rm 10, alone    HPI:  Marie Rocha is a 49 y.o. female ,  Afro american , right handed , and  seen here as a referral  from Dr. Brigitte Pulse for a sleep consultion .   Marie Rocha is seen here today for first visit in our sleep clinic for consultation. She states that she has been told that she snores by friends and family and that she sometimes feels more daytime sleepiness and she would consider normal. She also endorsed are an above average level of fatigue and daytime. The patient reports having had problems to control her blood pressure on currently two medications, valsartan and Amlodipine. She also takes Flonase to allow nasal breathing.  Marie Rocha is a mother of four and reports that her sleep time is defined by her children's needs. In childhood she was a sleep walker and occasional sleep talker. This has not affected her since adulthood. One of her daughters has night terrors.  Marie Rocha usually rises at about 6:30 the couple shares the bedroom and she gets up a little later. She is usually awake because her husband has meanwhile been broken. She does not rely on an alarm per se. She usually rises at 7 AM also she would like to rise earlier but she feels to him rested and to un-restored or refreshed to do so. At 7:00 she wakes a children but have to go to school by 8. The patient usually takes the half and half caffeinated coffee in the morning eats breakfast and leaves by 7:30 AM. She commutes for about 20 minutes by car. She works indoors but has window access. She drinks green tea in daytime, her work and about 5 PM and she then returns home. She has no exercise routine. She cooks dinner and feels exhausted .  She frequently falls asleep on the sofa before her bedtime. This  is usually while watching TV or being physically inactive, around 9-10 PM . It still maybe midnight until she transfers to bed. Sometimes she will have trouble falling asleep but usually she is rather promptly asleep. She will wake up once for bathroom break and otherwise sleeps through.she will get an e average of 6 -7 hours of sleep.  The bedroom that she shares with her husband as core, quiet, and dark. She usually has no discomfort or pain, she prefers to sleep in the side, but wakes up on her stomach. No pets.   The patient did never had a tonsillectomy, adenoidectomy any neck or airway surgery. There is no history of facial or skull trauma traumatic brain injury or cervical spine surgery.  There is no known family history of a sleep disorder.  Review of Systems: Out of a complete 14 system review, the patient complains of only the following symptoms, and all other reviewed systems are negative.    The fatigue severity score was endorsed at 35 points, the Epworth sleepiness score at 15 points, the geriatric depression score at 4 points.  Medications were reviewed. The patient has never been a smoker, she does not drink alcohol. He recently started on a weight loss plan. She has suffered from blood pressures as high as 938 systolic. She has now been on Norvasc and HCTZ that  consistently. Report palpitations, shortness of breath or pain with breathing. She works full time .  History   Social History  . Marital Status: Married    Spouse Name: Elberta Fortis    Number of Children: 4  . Years of Education: Masters   Occupational History  . Information Systems     Temporary   Social History Main Topics  . Smoking status: Never Smoker   . Smokeless tobacco: Never Used  . Alcohol Use: 0.0 oz/week     Comment: occasionaly  . Drug Use: No  . Sexual Activity: Not on file   Other Topics Concern  . Not on file   Social History Narrative   Patient is married Elberta Fortis)   Patient has four  children.   Patient has a Scientist, water quality.   Patient drinks one caffeine drink per week.          Family History  Problem Relation Age of Onset  . Hypertension Father   . Prostate cancer Father   . Lymphoma Father   . Hyperlipidemia Father   . Other Father     pacemaker  . Irregular heart beat Mother   . Hyperlipidemia Mother   . Depression Mother   . Alzheimer's disease Mother   . Dementia Mother   . Hypertension Sister   . Heart attack      Grandfather  . Colon cancer Neg Hx     Past Medical History  Diagnosis Date  . Hypertension   . Allergy     seasonal  . Primary snoring 12/02/2014    Past Surgical History  Procedure Laterality Date  . Cholecystectomy  1995    lap choli  . Diagnostic laparoscopy      exploratory-  . Mass excision  06/02/2012    Procedure: EXCISION MASS;  Surgeon: Wynonia Sours, MD;  Location: [facility];  Service: Orthopedics;  Laterality: Left;  Excision mass left thumb    Current Outpatient Prescriptions  Medication Sig Dispense Refill  . amLODipine (NORVASC) 5 MG tablet Take 5 mg by mouth daily.    . calcium carbonate 200 MG capsule Take by mouth daily.    . fluticasone (FLONASE) 50 MCG/ACT nasal spray Place 1 spray into both nostrils daily as needed.     . valsartan (DIOVAN) 160 MG tablet Take 160 mg by mouth daily.  6   No current facility-administered medications for this visit.    Allergies as of 12/02/2014  . (No Known Allergies)    Vitals: BP 124/89 mmHg  Pulse 73  Resp 16  Ht 5' 7.5" (1.715 m)  Wt 239 lb 4 oz (108.523 kg)  BMI 36.90 kg/m2 Last Weight:  Wt Readings from Last 1 Encounters:  12/02/14 239 lb 4 oz (108.523 kg)       Last Height:   Ht Readings from Last 1 Encounters:  12/02/14 5' 7.5" (1.715 m)    Physical exam:  General: The patient is awake, alert and appears not in acute distress. The patient is well groomed. Head: Normocephalic, atraumatic. Neck is supple. Mallampati 4  ,  neck  circumference:16.5  Nasal airflow  Restricted , TMJ is not  evident . Retrognathia is seen.  Postnasal drip.  Cardiovascular:  Regular rate and rhythm , without  murmurs or carotid bruit, and without distended neck veins. Respiratory: Lungs are clear to auscultation. Skin:  Without evidence of edema, or rash Trunk: BMI is severly elevated and patient  has normal posture.  Neurologic exam :  The patient is awake and alert, oriented to place and time.   Memory subjective described as intact. There is a normal attention span & concentration ability. Speech is fluent without  dysarthria, dysphonia or aphasia. Mood and affect are slightly depressed, fatigued.   Cranial nerves: Pupils are equal and briskly reactive to light. Funduscopic exam without  evidence of pallor or edema.  Extraocular movements  in vertical and horizontal planes intact and without nystagmus. Visual fields by finger perimetry are intact. Hearing to finger rub intact.  Facial sensation intact to fine touch. Facial motor strength is symmetric and tongue and uvula move midline. Shoulder shrug is equal.  Motor exam: Normal tone ,muscle bulk and symmetric ,strength in all extremities. Strong grip, able to provide good elbow felxion and complete relaxation as well.   Sensory:  Fine touch, pinprick and vibration were tested in all extremities. Proprioception is tested in the upper extremities only. This was  normal.  Coordination: Rapid alternating movements in the fingers/hands is normal. Finger-to-nose maneuver without evidence of ataxia, dysmetria or tremor.  Gait and station: Patient walks without assistive device and is able unassisted to climb up to the exam table.  Strength within normal limits. Stance is stable and normal.  Tandem gait is unfragmented.   Deep tendon reflexes: in the  upper and lower extremities are symmetric and intact. Babinski maneuver downgoing.   Assessment:  After physical and neurologic examination,  review of laboratory studies, imaging, neurophysiology testing and pre-existing records, assessment is:   1) OSA risk  high due to witnessed snoring and apnea, large BMI , and retrognathia.  2) the patient has to to improve some sleep hygiene . Her bedroom TV is not working - and should stay off.  Advised to eliminate screen lights, and sounds form the bedroom.  3) use an audio book or pandora relaxed music to help you sleep if you crave sound  4) caffeine to be eliminated after lunch.  5) take a walk after lunch and  dinner.  Eliminate your nap time.    The patient was advised of the nature of the diagnosed sleep disorder , the treatment options and risks for general a health and wellness arising from not treating the condition. Visit duration was 45 minutes, face to face time was pent at 60% of this time to answer all questions and provide a discussion and education on how  to improve sleep quality and surroundings for the patient.  My main goal is to obtain a sleep study to see if sleep apnea is present, to what degree and what kind of sleep apnea is present. If obstructive sleep apnea is present we will split the study into a CPAP titration. This does not mean that CPAP is the only treatment option. There are dental device as it can be used in mild-to-moderate apnea -if not associated with REM sleep accentuation or with oxygen desaturation.    Plan:  Treatment plan and additional workup :  SPLIT night, sleep diary .   Asencion Partridge Toluwanimi Radebaugh MD  12/02/2014

## 2014-12-27 ENCOUNTER — Ambulatory Visit (INDEPENDENT_AMBULATORY_CARE_PROVIDER_SITE_OTHER): Payer: BLUE CROSS/BLUE SHIELD | Admitting: Neurology

## 2014-12-27 DIAGNOSIS — E662 Morbid (severe) obesity with alveolar hypoventilation: Secondary | ICD-10-CM | POA: Diagnosis not present

## 2014-12-27 DIAGNOSIS — R0683 Snoring: Secondary | ICD-10-CM | POA: Diagnosis not present

## 2014-12-27 DIAGNOSIS — E669 Obesity, unspecified: Secondary | ICD-10-CM | POA: Diagnosis not present

## 2014-12-27 DIAGNOSIS — G4763 Sleep related bruxism: Secondary | ICD-10-CM

## 2014-12-28 NOTE — Sleep Study (Signed)
Please see the scanned sleep study interpretation located in the Procedure tab within the Chart Review section. 

## 2015-01-11 ENCOUNTER — Other Ambulatory Visit: Payer: Self-pay | Admitting: Neurology

## 2015-01-11 ENCOUNTER — Telehealth: Payer: Self-pay | Admitting: *Deleted

## 2015-01-11 ENCOUNTER — Encounter: Payer: Self-pay | Admitting: Neurology

## 2015-01-11 DIAGNOSIS — R0902 Hypoxemia: Secondary | ICD-10-CM

## 2015-01-11 DIAGNOSIS — G4733 Obstructive sleep apnea (adult) (pediatric): Secondary | ICD-10-CM

## 2015-01-11 NOTE — Telephone Encounter (Signed)
Patient was contacted and provided the results of her sleep study which did reveal a diagnosis of OSA.  Patient was informed that CPAP therapy was advised and that a subsequent CPAP titration study was ordered.  Numerous questions were addressed and then the patient did schedule her CPAP titration study for April 4th at 9:30 pm.  The patient gave verbal permission to mail a copy of her test results.  Dr. Marton Redwood was faxed a copy of the report.

## 2015-01-26 ENCOUNTER — Ambulatory Visit (INDEPENDENT_AMBULATORY_CARE_PROVIDER_SITE_OTHER): Payer: BLUE CROSS/BLUE SHIELD | Admitting: Neurology

## 2015-01-26 DIAGNOSIS — R0902 Hypoxemia: Secondary | ICD-10-CM

## 2015-01-26 DIAGNOSIS — G4733 Obstructive sleep apnea (adult) (pediatric): Secondary | ICD-10-CM | POA: Diagnosis not present

## 2015-01-27 NOTE — Sleep Study (Signed)
Please see the scanned sleep study interpretation located in the Procedure tab within the Chart Review section. 

## 2015-01-31 DIAGNOSIS — G4733 Obstructive sleep apnea (adult) (pediatric): Secondary | ICD-10-CM | POA: Insufficient documentation

## 2015-01-31 DIAGNOSIS — R0902 Hypoxemia: Secondary | ICD-10-CM | POA: Insufficient documentation

## 2015-02-02 ENCOUNTER — Encounter: Payer: Self-pay | Admitting: Neurology

## 2015-02-02 ENCOUNTER — Telehealth: Payer: Self-pay | Admitting: *Deleted

## 2015-02-02 NOTE — Telephone Encounter (Signed)
The patient was contacted and provided the results of her CPAP titration sleep study.  The patient was informed that the therapy was considered effective in treatment.  The patient expressed an eagerness to begin therapy since she could feel the positive effects it had on the night of her study.  The patient was referred to [facility] for CPAP set up.   Patient instructed to contact our office 6-8 weeks post set up to schedule a follow up appointment.  The patient gave verbal permission to mail a copy of her test results.

## 2015-02-07 ENCOUNTER — Telehealth: Payer: Self-pay

## 2015-02-07 NOTE — Telephone Encounter (Signed)
I spoke to Marie Rocha and she is aware of her sleep study results. She is willing to proceed with CPAP treatment and states that she is expecting a call from [place]. Patient will also need to make appt with Dr. Brett Fairy for CPAP compliance.

## 2015-02-09 ENCOUNTER — Other Ambulatory Visit: Payer: Self-pay | Admitting: Neurology

## 2015-02-09 DIAGNOSIS — G4733 Obstructive sleep apnea (adult) (pediatric): Secondary | ICD-10-CM

## 2015-02-11 NOTE — Telephone Encounter (Signed)
Patient was referred to [facility] for CPAP set up.  [facility] has confirmed receipt of referral.

## 2015-10-04 ENCOUNTER — Telehealth: Payer: Self-pay | Admitting: Neurology

## 2015-10-04 NOTE — Telephone Encounter (Signed)
Spoke to pt. Pt has not followed up with Dr. Brett Fairy after starting her CPAP. Appt made with pt for 12/22 at 1:30. Pt verbalized understanding to bring her CPAP to this appt.

## 2015-10-04 NOTE — Telephone Encounter (Signed)
Pt called and has a question abbout her cpap and she is also not sure if she is in need of a follow up apt wDr. Dohmeier. Pt said that she thought there was a problem w her cpap leaking. Please call pt and advise.

## 2015-10-19 ENCOUNTER — Ambulatory Visit (INDEPENDENT_AMBULATORY_CARE_PROVIDER_SITE_OTHER): Payer: BC Managed Care – PPO | Admitting: Neurology

## 2015-10-19 ENCOUNTER — Encounter: Payer: Self-pay | Admitting: Neurology

## 2015-10-19 VITALS — BP 124/78 | HR 66 | Resp 16 | Ht 67.5 in | Wt 238.0 lb

## 2015-10-19 DIAGNOSIS — G4733 Obstructive sleep apnea (adult) (pediatric): Secondary | ICD-10-CM

## 2015-10-19 DIAGNOSIS — G4719 Other hypersomnia: Secondary | ICD-10-CM | POA: Diagnosis not present

## 2015-10-19 DIAGNOSIS — Z9989 Dependence on other enabling machines and devices: Principal | ICD-10-CM

## 2015-10-19 DIAGNOSIS — R351 Nocturia: Secondary | ICD-10-CM | POA: Insufficient documentation

## 2015-10-19 NOTE — Progress Notes (Signed)
SLEEP MEDICINE CLINIC   Provider:  Larey Seat, M D  Referring Provider: Marton Redwood, MD Primary Care Physician:  Marton Redwood, MD  Chief Complaint  Patient presents with  . Sleep Apnea    Sts. she is compliant with CPAP therapy--sts. uses it for at least 4 hours every night.  Sts. she feels more awake, energetic during the day/fim    HPI:  Marie Rocha is a 49 y.o. female ,  Afro american , right handed , and  seen here as a referral  from Dr. Brigitte Pulse for a sleep consultion .   Marie Rocha is seen here today for first visit in our sleep clinic for consultation. She states that she has been told that she snores by friends and family and that she sometimes feels more daytime sleepiness and she would consider normal. She also endorsed are an above average level of fatigue and daytime. The patient reports having had problems to control her blood pressure on currently two medications, valsartan and Amlodipine. She also takes Flonase to allow nasal breathing.  Marie Rocha is a mother of four and reports that her sleep time is defined by her children's needs. In childhood she was a sleep walker and occasional sleep talker. This has not affected her since adulthood. One of her daughters has night terrors.Mr. Houlihan usually rises at about 6:30 the couple shares the bedroom and she gets up a little later. She is usually awake because her husband has meanwhile been broken. She does not rely on an alarm per se. She usually rises at 7 AM also she would like to rise earlier but she feels to him rested and to un-restored or refreshed to do so. At 7:00 she wakes a children but have to go to school by 8. The patient usually takes the half and half caffeinated coffee in the morning eats breakfast and leaves by 7:30 AM. She commutes for about 20 minutes by car. She works indoors but has window access. She drinks green tea in daytime, her work and about 5 PM and she then returns home. She has no  exercise routine. She cooks dinner and feels exhausted .  She frequently falls asleep on the sofa before her bedtime. This is usually while watching TV or being physically inactive, around 9-10 PM . It still maybe midnight until she transfers to bed. Sometimes she will have trouble falling asleep but usually she is rather promptly asleep. She will wake up once for bathroom break and otherwise sleeps through.she will get an e average of 6 -7 hours of sleep.  The bedroom that she shares with her husband as core, quiet, and dark. She usually has no discomfort or pain, she prefers to sleep in the side, but wakes up on her stomach. No pets.  The patient did never had a tonsillectomy, adenoidectomy any neck or airway surgery. There is no history of facial or skull trauma traumatic brain injury or cervical spine surgery. There is no known family history of a sleep disorder.  PLAN 1) OSA risk  high due to witnessed snoring and apnea, large BMI , and retrognathia.  2) the patient has to to improve some sleep hygiene . Her bedroom TV is not working - and should stay off.  Advised to eliminate screen lights, and sounds form the bedroom.  3) use an audio book or pandora relaxed music to help you sleep if you crave sound  4) caffeine to be eliminated after lunch.  5)  take a walk after lunch and  dinner. Eliminate your nap time.   Interval  history from 10-19-15; She underwent two sleep studies, the first on 12-27-14 with an AHI of 28.3 and a REM AHI of 71.8 she was asked to return for CPAP titration on 3-30 1-16. She was titrated to 8 cm water pressure but the AHI was was still 2.7 for this reason she was started on auto-titration. We are meeting today to see the results. This is the first follow-up visit after almost 5 months on CPAP. The patient has used the device for 25 days out of the last 30 days, compliance for days of usage is 83, 3% she has used the machine 73% of the time over 4 hours at night average  user time for all night is 4 hours and 33 minutes the average AHI is only 2.1 now on CPAP. She uses an adaptive heater, and an outer titration between 4 and 10 cm water with 3 cm EPR 91st percentile pressure is 9 cm water. She will have a follow-up visit in 6 months was minus practitioner. I am thinking there is room to improve compliance for Zyrtec she could go to 5 hours average at night likely this would also help her sleepiness. I am happy however was whole the AHI has resolved. Today's Epworth sleepiness score was endorsed at 12 points, fatigue severity at 17       Review of Systems: Out of a complete 14 system review, the patient complains of only the following symptoms, and all other reviewed systems are negative. Today's Epworth sleepiness score was endorsed at 12 points, fatigue severity at 17, the geriatric depression score at 4 points.  Medications were reviewed: She has suffered from blood pressures as high as 99991111 systolic. This has improved on CPAP. No longer nocturia since on CPAP, wakes up refreshed and restored.  She has now been on Norvasc and HCTZ that controlled the HTN .  The patient has never been a smoker,   She is not exposed to second hand smoke. she does not drink alcohol. He recently started on a weight loss plan. Report palpitations, shortness of breath or pain with breathing. She works full time .  Social History   Social History  . Marital Status: Married    Spouse Name: Marie Rocha  . Number of Children: 4  . Years of Education: Masters   Occupational History  . Information Systems     Temporary   Social History Main Topics  . Smoking status: Never Smoker   . Smokeless tobacco: Never Used  . Alcohol Use: 0.0 oz/week     Comment: occasionaly  . Drug Use: No  . Sexual Activity: Not on file   Other Topics Concern  . Not on file   Social History Narrative   Patient is married Marie Rocha)   Patient has four children.   Patient has a Scientist, water quality.    Patient drinks one caffeine drink per week.          Family History  Problem Relation Age of Onset  . Hypertension Father   . Prostate cancer Father   . Lymphoma Father   . Hyperlipidemia Father   . Other Father     pacemaker  . Irregular heart beat Mother   . Hyperlipidemia Mother   . Depression Mother   . Alzheimer's disease Mother   . Dementia Mother   . Hypertension Sister   . Heart attack  Grandfather  . Colon cancer Neg Hx     Past Medical History  Diagnosis Date  . Hypertension   . Allergy     seasonal  . Primary snoring 12/02/2014    Past Surgical History  Procedure Laterality Date  . Cholecystectomy  1995    lap choli  . Diagnostic laparoscopy      exploratory-  . Mass excision  06/02/2012    Procedure: EXCISION MASS;  Surgeon: Wynonia Sours, MD;  Location: [facility];  Service: Orthopedics;  Laterality: Left;  Excision mass left thumb    Current Outpatient Prescriptions  Medication Sig Dispense Refill  . amLODipine (NORVASC) 5 MG tablet Take 5 mg by mouth daily. Reported on 10/19/2015    . calcium carbonate 200 MG capsule Take by mouth daily.    . fluticasone (FLONASE) 50 MCG/ACT nasal spray Place 1 spray into both nostrils daily as needed.     . valsartan (DIOVAN) 160 MG tablet Take 160 mg by mouth daily.  6   No current facility-administered medications for this visit.    Allergies as of 10/19/2015  . (No Known Allergies)    Vitals: BP 124/78 mmHg  Pulse 66  Resp 16  Ht 5' 7.5" (1.715 m)  Wt 238 lb (107.956 kg)  BMI 36.70 kg/m2 Last Weight:  Wt Readings from Last 1 Encounters:  10/19/15 238 lb (107.956 kg)       Last Height:   Ht Readings from Last 1 Encounters:  10/19/15 5' 7.5" (1.715 m)    Physical exam:  General: The patient is awake, alert and appears not in acute distress. The patient is well groomed. Head: Normocephalic, atraumatic. Neck is supple. Mallampati 4  ,  neck circumference:16.5  Nasal airflow   Restricted , TMJ is not  evident . Retrognathia is seen.  Postnasal drip.  Cardiovascular:  Regular rate and rhythm , without  murmurs or carotid bruit, and without distended neck veins. Respiratory: Lungs are clear to auscultation. Skin:  Without evidence of edema, or rash Trunk: BMI is severly elevated and patient  has normal posture.  Neurologic exam : The patient is awake and alert, oriented to place and time.   Memory subjective described as intact. There is a normal attention span & concentration ability. Speech is fluent without  dysarthria, dysphonia or aphasia. Mood and affect are slightly depressed, fatigued.   Cranial nerves: Pupils are equal and briskly reactive to light. Funduscopic exam without  evidence of pallor or edema.  Extraocular movements  in vertical and horizontal planes intact and without nystagmus. Visual fields by finger perimetry are intact. Hearing to finger rub intact.  Facial sensation intact to fine touch. Facial motor strength is symmetric and tongue and uvula move midline. Shoulder shrug is equal.  Motor exam: Normal tone ,muscle bulk and symmetric ,strength in all extremities. Strong grip, able to provide good elbow felxion and complete relaxation as well.   Assessment:  After physical and neurologic examination, review of laboratory studies, imaging, neurophysiology testing and pre-existing records, assessment is:   OSA , 28 AHI, reduced to AHI  2.2 on auto CPAP, dream station, 4-10 cm water . Solution of insomnia, sleep apnea, snoring, nocturia, improvement of excessive daytime sleepiness and fatigue. Improvement also of the Post nasal drip and chronic sphenoid sinusitis   The patient was advised of the nature of the diagnosed sleep disorder , the treatment options and risks for general a health and wellness arising from not treating the  condition. Visit duration was 25 minutes, face to face time was pent at 60% of this time to answer all questions and  provide a discussion and education on how  to improve sleep quality and surroundings for the patient.   Plan:  Treatment plan and additional workup : Rv in 6 month with new compliance download , her  DME is [facility]. NP     Asencion Partridge Lita Flynn MD  10/19/2015   Cc ;  Carmie Kanner , MD

## 2016-02-12 ENCOUNTER — Other Ambulatory Visit (HOSPITAL_COMMUNITY): Payer: Self-pay | Admitting: Internal Medicine

## 2016-02-12 DIAGNOSIS — R9431 Abnormal electrocardiogram [ECG] [EKG]: Secondary | ICD-10-CM

## 2016-02-15 ENCOUNTER — Ambulatory Visit (HOSPITAL_COMMUNITY)
Admission: RE | Admit: 2016-02-15 | Discharge: 2016-02-15 | Disposition: A | Discharge status: Home / Self Care | Payer: BC Managed Care – PPO | Source: Ambulatory Visit | Attending: Cardiology | Admitting: Cardiology

## 2016-02-15 DIAGNOSIS — R9431 Abnormal electrocardiogram [ECG] [EKG]: Secondary | ICD-10-CM | POA: Diagnosis not present

## 2016-02-15 DIAGNOSIS — I340 Nonrheumatic mitral (valve) insufficiency: Secondary | ICD-10-CM | POA: Diagnosis not present

## 2016-02-15 DIAGNOSIS — E785 Hyperlipidemia, unspecified: Secondary | ICD-10-CM | POA: Insufficient documentation

## 2016-02-15 DIAGNOSIS — I119 Hypertensive heart disease without heart failure: Secondary | ICD-10-CM | POA: Insufficient documentation

## 2016-02-15 DIAGNOSIS — I071 Rheumatic tricuspid insufficiency: Secondary | ICD-10-CM | POA: Diagnosis not present

## 2016-02-15 DIAGNOSIS — I2510 Atherosclerotic heart disease of native coronary artery without angina pectoris: Secondary | ICD-10-CM | POA: Insufficient documentation

## 2016-04-18 ENCOUNTER — Ambulatory Visit: Payer: BC Managed Care – PPO | Admitting: Adult Health

## 2016-04-24 ENCOUNTER — Encounter: Payer: Self-pay | Admitting: Adult Health

## 2016-04-24 ENCOUNTER — Telehealth: Payer: Self-pay | Admitting: Adult Health

## 2016-04-24 ENCOUNTER — Ambulatory Visit (INDEPENDENT_AMBULATORY_CARE_PROVIDER_SITE_OTHER): Payer: BC Managed Care – PPO | Admitting: Adult Health

## 2016-04-24 VITALS — BP 130/84 | HR 60 | Ht 67.5 in | Wt 234.4 lb

## 2016-04-24 DIAGNOSIS — G4733 Obstructive sleep apnea (adult) (pediatric): Secondary | ICD-10-CM

## 2016-04-24 DIAGNOSIS — Z9989 Dependence on other enabling machines and devices: Principal | ICD-10-CM

## 2016-04-24 NOTE — Progress Notes (Addendum)
PATIENT: Marie Rocha DOB: 26-Nov-1965  REASON FOR VISIT: follow up- obstructive sleep apnea on CPAP HISTORY FROM: patient  HISTORY OF PRESENT ILLNESS: Today 04/24/2016 [name] is a 50 year old female with a history of obstructive sleep apnea on CPAP. She returns today for a compliance download. Her download indicates that she uses her machine 27 out of 30 days for compliance of 90%. She uses her machine greater than 4 hours  83.3%. Her residual AHI is 1.7. Her average pressure is 8.8 cm of water. She does not have a significant leak. Her fatigue severity score is 19 and Epworth sleepiness score is 7. She does feel that the machine has been beneficial. She states that she is getting used to it now. She has noticed a difference with her daytime sleepiness. She uses the nasal mask. She denies any new neurological symptoms. She returns today for an evaluation.  HISTORY (DOHMEIER)Marie Rocha Patience is a 50 y.o. female , Afro american , right handed , and seen here as a referral from Dr. Brigitte Pulse for a sleep consultion .   Marie Rocha is seen here today for first visit in our sleep clinic for consultation. She states that she has been told that she snores by friends and family and that she sometimes feels more daytime sleepiness and she would consider normal. She also endorsed are an above average level of fatigue and daytime. The patient reports having had problems to control her blood pressure on currently two medications, valsartan and Amlodipine. She also takes Flonase to allow nasal breathing.  Marie Rocha is a mother of four and reports that her sleep time is defined by her children's needs. In childhood she was a sleep walker and occasional sleep talker. This has not affected her since adulthood. One of her daughters has night terrors.Mr. Marie Rocha usually rises at about 6:30 the couple shares the bedroom and she gets up a little later. She is usually awake because her husband has meanwhile  been broken. She does not rely on an alarm per se. She usually rises at 7 AM also she would like to rise earlier but she feels to him rested and to un-restored or refreshed to do so. At 7:00 she wakes a children but have to go to school by 8. The patient usually takes the half and half caffeinated coffee in the morning eats breakfast and leaves by 7:30 AM. She commutes for about 20 minutes by car. She works indoors but has window access. She drinks green tea in daytime, her work and about 5 PM and she then returns home. She has no exercise routine. She cooks dinner and feels exhausted .  She frequently falls asleep on the sofa before her bedtime. This is usually while watching TV or being physically inactive, around 9-10 PM . It still maybe midnight until she transfers to bed. Sometimes she will have trouble falling asleep but usually she is rather promptly asleep. She will wake up once for bathroom break and otherwise sleeps through.she will get an e average of 6 -7 hours of sleep.  The bedroom that she shares with her husband as core, quiet, and dark. She usually has no discomfort or pain, she prefers to sleep in the side, but wakes up on her stomach. No pets.  The patient did never had a tonsillectomy, adenoidectomy any neck or airway surgery. There is no history of facial or skull trauma traumatic brain injury or cervical spine surgery. There is no known family history  of a sleep disorder.  PLAN 1) OSA risk high due to witnessed snoring and apnea, large BMI , and retrognathia.  2) the patient has to to improve some sleep hygiene . Her bedroom TV is not working - and should stay off. Advised to eliminate screen lights, and sounds form the bedroom.  3) use an audio book or pandora relaxed music to help you sleep if you crave sound  4) caffeine to be eliminated after lunch.  5) take a walk after lunch and dinner. Eliminate your nap time.   Interval history from 10-19-15; She underwent two  sleep studies, the first on 12-27-14 with an AHI of 28.3 and a REM AHI of 71.8 she was asked to return for CPAP titration on 3-30 1-16. She was titrated to 8 cm water pressure but the AHI was was still 2.7 for this reason she was started on auto-titration. We are meeting today to see the results.  This is the first follow-up visit after almost 5 months on CPAP. The patient has used the device for 25 days out of the last 30 days, compliance for days of usage is 83, 3% she has used the machine 73% of the time over 4 hours at night average user time for all night is 4 hours and 33 minutes the average AHI is only 2.1 now on CPAP. She uses an adaptive heater, and an outer titration between 4 and 10 cm water with 3 cm EPR 91st percentile pressure is 9 cm water. She will have a follow-up visit in 6 months was minus practitioner. I am thinking there is room to improve compliance for Zyrtec she could go to 5 hours average at night likely this would also help her sleepiness. I am happy however was whole the AHI has resolved. Today's Epworth sleepiness score was endorsed at 12 points, fatigue severity at 17 :  REVIEW OF SYSTEMS: Out of a complete 14 system review of symptoms, the patient complains only of the following symptoms, and all other reviewed systems are negative.  ALLERGIES: No Known Allergies  HOME MEDICATIONS: Outpatient Prescriptions Prior to Visit  Medication Sig Dispense Refill  . amLODipine (NORVASC) 5 MG tablet Take 5 mg by mouth daily. Reported on 10/19/2015    . calcium carbonate 200 MG capsule Take by mouth daily.    . fluticasone (FLONASE) 50 MCG/ACT nasal spray Place 1 spray into both nostrils daily as needed.     . valsartan (DIOVAN) 160 MG tablet Take 160 mg by mouth daily.  6   No facility-administered medications prior to visit.    PAST MEDICAL HISTORY: Past Medical History  Diagnosis Date  . Hypertension   . Allergy     seasonal  . Primary snoring 12/02/2014    PAST  SURGICAL HISTORY: Past Surgical History  Procedure Laterality Date  . Cholecystectomy  1995    lap choli  . Diagnostic laparoscopy      exploratory-  . Mass excision  06/02/2012    Procedure: EXCISION MASS;  Surgeon: Wynonia Sours, MD;  Location: [place];  Service: Orthopedics;  Laterality: Left;  Excision mass left thumb    FAMILY HISTORY: Family History  Problem Relation Age of Onset  . Hypertension Father   . Prostate cancer Father   . Lymphoma Father   . Hyperlipidemia Father   . Other Father     pacemaker  . Irregular heart beat Mother   . Hyperlipidemia Mother   . Depression Mother   .  Alzheimer's disease Mother   . Dementia Mother   . Hypertension Sister   . Heart attack      Grandfather  . Colon cancer Neg Hx     SOCIAL HISTORY: Social History   Social History  . Marital Status: Married    Spouse Name: Elberta Fortis  . Number of Children: 4  . Years of Education: Masters   Occupational History  . Information Systems     Temporary   Social History Main Topics  . Smoking status: Never Smoker   . Smokeless tobacco: Never Used  . Alcohol Use: 0.0 oz/week     Comment: occasionaly  . Drug Use: No  . Sexual Activity: Not on file   Other Topics Concern  . Not on file   Social History Narrative   Patient is married Elberta Fortis)   Patient has four children.   Patient has a Scientist, water quality.   Patient drinks one caffeine drink per week.            PHYSICAL EXAM  Filed Vitals:   04/24/16 1042  BP: 130/84  Pulse: 60  Height: 5' 7.5" (1.715 m)  Weight: 234 lb 6.4 oz (106.323 kg)   Body mass index is 36.15 kg/(m^2).  Generalized: Well developed, in no acute distress  Neck: Circumference 15 inches, Mallampati 4+  Neurological examination  Mentation: Alert oriented to time, place, history taking. Follows all commands speech and language fluent Cranial nerve II-XII: Pupils were equal round reactive to light. Extraocular movements were full,  visual field were full on confrontational test. Facial sensation and strength were normal. Uvula tongue midline. Head turning and shoulder shrug  were normal and symmetric. Motor: The motor testing reveals 5 over 5 strength of all 4 extremities. Good symmetric motor tone is noted throughout.  Sensory: Sensory testing is intact to soft touch on all 4 extremities. No evidence of extinction is noted.  Coordination: Cerebellar testing reveals good finger-nose-finger and heel-to-shin bilaterally.  Gait and station: Gait is normal. Romberg is negative. No drift is seen.  Reflexes: Deep tendon reflexes are symmetric and normal bilaterally.   DIAGNOSTIC DATA (LABS, IMAGING, TESTING) - I reviewed patient records, labs, notes, testing and imaging myself where available.       ASSESSMENT AND PLAN 50 y.o. year old female  has a past medical history of Hypertension; Allergy; and Primary snoring (12/02/2014). here with:  1. Obstructive sleep apnea on CPAP  The patient's download is excellent. She shows excellent compliance and good treatment of her apnea. Patient is encouraged to continue using the CPAP nightly. If her symptoms worsen or she develops any new symptoms she should let us know. She will follow-up in 6 months with Dr. Brett Fairy.     Ward Givens, MSN, NP-C 04/24/2016, 11:02 AM [facility] [address], [place], [place] [address] [phone]   I reviewed the above note and documentation by the Nurse Practitioner and agree with the history, physical exam, assessment and plan as outlined above. I was immediately available for face-to-face consultation. Star Age, MD, PhD [facility])

## 2016-04-24 NOTE — Telephone Encounter (Signed)
Patient called 9:37am to advise she forgot to bring CPAP machine with her for 10:00am appointment this morning with [facility]. I advised she didn't need to bring machine since this was not the initial CPAP follow up visit. I asked if she had the chip to the machine with her? Patient does not have chip. Patient advised to come to appointment, nurse [facility] notified.

## 2016-04-24 NOTE — Patient Instructions (Signed)
Continue using CPAP nightly If your symptoms worsen or you develop new symptoms please let us know.   

## 2016-09-05 ENCOUNTER — Encounter: Payer: Self-pay | Admitting: Neurology

## 2016-10-30 ENCOUNTER — Ambulatory Visit: Payer: BC Managed Care – PPO | Admitting: Neurology

## 2016-11-26 ENCOUNTER — Encounter: Payer: Self-pay | Admitting: Neurology

## 2016-11-26 ENCOUNTER — Ambulatory Visit (INDEPENDENT_AMBULATORY_CARE_PROVIDER_SITE_OTHER): Payer: BC Managed Care – PPO | Admitting: Neurology

## 2016-11-26 VITALS — BP 140/83 | HR 63 | Resp 20 | Ht 67.0 in | Wt 240.0 lb

## 2016-11-26 DIAGNOSIS — Z9989 Dependence on other enabling machines and devices: Secondary | ICD-10-CM

## 2016-11-26 DIAGNOSIS — M62830 Muscle spasm of back: Secondary | ICD-10-CM | POA: Diagnosis not present

## 2016-11-26 DIAGNOSIS — G4701 Insomnia due to medical condition: Secondary | ICD-10-CM | POA: Diagnosis not present

## 2016-11-26 DIAGNOSIS — G4733 Obstructive sleep apnea (adult) (pediatric): Secondary | ICD-10-CM

## 2016-11-26 MED ORDER — CYCLOBENZAPRINE HCL 10 MG PO TABS: 10.0000 mg | ORAL_TABLET | Freq: Every day | ORAL | 3 refills | Status: DC

## 2016-11-26 NOTE — Progress Notes (Signed)
SLEEP MEDICINE CLINIC   Provider:  Larey Seat, M D  Referring Provider: Marton Redwood, MD Primary Care Physician:  Marton Redwood, MD  Chief Complaint  Patient presents with  . Follow-up    cpap    HPI:  Marie Rocha is a 51 y.o. female ,  Afro american , right handed , and  seen here as a referral  from Dr. Brigitte Pulse for a sleep consultion .   Marie Rocha is seen here today for first visit in our sleep clinic for consultation. She states that she has been told that she snores by friends and family and that she sometimes feels more daytime sleepiness and she would consider normal. She also endorsed are an above average level of fatigue and daytime. The patient reports having had problems to control her blood pressure on currently two medications, valsartan and Amlodipine. She also takes Flonase to allow nasal breathing.  Marie Rocha is a mother of four and reports that her sleep time is defined by her children's needs. In childhood she was a sleep walker and occasional sleep talker. This has not affected her since adulthood. One of her daughters has night terrors. Mr.Medinger usually rises at about 6:30 the couple shares the bedroom and she gets up a little later. She is usually awake because her husband has meanwhile been broken. She does not rely on an alarm per se. She usually rises at 7 AM also she would like to rise earlier but she feels to him rested and to un-restored or refreshed to do so. At 7:00 she wakes a children but have to go to school by 8. The patient usually takes the half and half caffeinated coffee in the morning eats breakfast and leaves by 7:30 AM. She commutes for about 20 minutes by car. She works indoors but has window access. She drinks green tea in daytime, her work and about 5 PM and she then returns home. She has no exercise routine. She cooks dinner and feels exhausted .  She frequently falls asleep on the sofa before her bedtime. This is usually while watching  TV or being physically inactive, around 9-10 PM . It still maybe midnight until she transfers to bed. Sometimes she will have trouble falling asleep but usually she is rather promptly asleep. She will wake up once for bathroom break and otherwise sleeps through.she will get an e average of 6 -7 hours of sleep.  The bedroom that she shares with her husband as core, quiet, and dark. She usually has no discomfort or pain, she prefers to sleep in the side, but wakes up on her stomach. No pets.  The patient did never had a tonsillectomy, adenoidectomy any neck or airway surgery. There is no history of facial or skull trauma traumatic brain injury or cervical spine surgery. There is no known family history of a sleep disorder.  Interval history from 10-19-15; She underwent two sleep studies, the first on 12-27-14 with an AHI of 28.3 and a REM AHI of 71.8 she was asked to return for CPAP titration on 01-26-15. She was titrated to 8 cm water pressure but the AHI was was still 2.7 for this reason she was started on auto-titration. We are meeting today to see the results. This is the first follow-up visit after almost 5 months on CPAP. The patient has used the device for 25 days out of the last 30 days, compliance for days of usage is 83, 3% she has used the machine  73% of the time over 4 hours at night average user time for all night is 4 hours and 33 minutes the average AHI is only 2.1 now on CPAP. She uses an adaptive heater, and auto-titration pressures  between 4 and 10 cm water with 3 cm EPR 95% percentile pressure is 9 cm water. She will have a follow-up visit in 6 months was minus practitioner. I am thinking there is room to improve compliance for Zyrtec she could go to 5 hours average at night likely this would also help her sleepiness. I am happy the AHI has resolved. Today's Epworth sleepiness score was endorsed at 12 points, fatigue severity at 17.  Interval history from 11/26/2016, I had the pleasure of  seeing Marie Rocha again today, who was Epworth Sleepiness Scale is meanwhile endorsed at only 7 points fatigue severity reduced to 19 points. She has continued to use CPAP in the treatment of obstructive sleep apnea, has used CPAP since April 2016.  She experienced air leaks. Average AHI has been low, her 95th percentile pressure remains close to 10 cm water. Compliance has been 90% 4 days a 70% for over 4 hours of nightly use. AHI is 1.9. There is some room for improvement she reports that she sometimes holds inadvertently and unconsciously the mask off at night and wakes up with her gear next to her.   .   Review of Systems: Out of a complete 14 system review, the patient complains of only the following symptoms, and all other reviewed systems are negative. Today's Epworth sleepiness score was endorsed at 12 points, fatigue severity at 17, the geriatric depression score at 4 points.  Medications were reviewed: She has suffered from blood pressures as high as 99991111 systolic. This has improved on CPAP. No longer nocturia since on CPAP, wakes up refreshed and restored.  She has now been on Norvasc and HCTZ - both medications  controlled the HTN .  The patient has never been a smoker,  She uses prn Flexaril for muscle relaxer. Uses it at night.  She is not exposed to second hand smoke. she does not drink alcohol. He recently started on a weight loss plan. Report palpitations, shortness of breath or pain with breathing. She works full time .  Social History   Social History  . Marital status: Married    Spouse name: Marie Rocha  . Number of children: 4  . Years of education: Masters   Occupational History  . Information Systems Self Employeed    Temporary   Social History Main Topics  . Smoking status: Never Smoker  . Smokeless tobacco: Never Used  . Alcohol use 0.0 oz/week     Comment: occasionaly  . Drug use: No  . Sexual activity: Not on file   Other Topics Concern  . Not on file    Social History Narrative   Patient is married Marie Rocha)   Patient has four children.   Patient has a Scientist, water quality.   Patient drinks one caffeine drink per week.          Family History  Problem Relation Age of Onset  . Hypertension Father   . Prostate cancer Father   . Lymphoma Father   . Hyperlipidemia Father   . Other Father     pacemaker  . Irregular heart beat Mother   . Hyperlipidemia Mother   . Depression Mother   . Alzheimer's disease Mother   . Dementia Mother   . Hypertension Sister   .  Heart attack      Grandfather  . Colon cancer Neg Hx     Past Medical History:  Diagnosis Date  . Allergy    seasonal  . Hypertension   . Primary snoring 12/02/2014    Past Surgical History:  Procedure Laterality Date  . CHOLECYSTECTOMY  1995   lap choli  . DIAGNOSTIC LAPAROSCOPY     exploratory-  . MASS EXCISION  06/02/2012   Procedure: EXCISION MASS;  Surgeon: Wynonia Sours, MD;  Location: [place];  Service: Orthopedics;  Laterality: Left;  Excision mass left thumb    Current Outpatient Prescriptions  Medication Sig Dispense Refill  . amLODipine (NORVASC) 5 MG tablet Take 5 mg by mouth daily. Reported on 10/19/2015    . calcium carbonate 200 MG capsule Take by mouth daily.    . fluticasone (FLONASE) 50 MCG/ACT nasal spray Place 1 spray into both nostrils daily as needed.     . valsartan (DIOVAN) 160 MG tablet Take 160 mg by mouth daily.  6   No current facility-administered medications for this visit.     Allergies as of 11/26/2016  . (No Known Allergies)    Vitals: BP 140/83   Pulse 63   Resp 20   Ht 5\' 7"  (1.702 m)   Wt 240 lb (108.9 kg)   BMI 37.59 kg/m  Last Weight:  Wt Readings from Last 1 Encounters:  11/26/16 240 lb (108.9 kg)       Last Height:   Ht Readings from Last 1 Encounters:  11/26/16 5\' 7"  (1.702 m)    Physical exam:  General: The patient is awake, alert and appears not in acute distress. The patient is well  groomed. Head: Normocephalic, atraumatic. Neck is supple. Mallampati 4  ,  neck circumference:16.5  Nasal airflow  Restricted , TMJ is not  evident . Retrognathia is seen.  Postnasal drip.  Cardiovascular:  Regular rate and rhythm , without  murmurs or carotid bruit, and without distended neck veins. Respiratory: Lungs are clear to auscultation. Skin:  Without evidence of edema, or rash Trunk: BMI is severly elevated and patient  has normal posture.  Neurologic exam : The patient is awake and alert, oriented to place and time.   Memory subjective described as intact. There is a normal attention span & concentration ability. Speech is fluent without  dysarthria, dysphonia or aphasia. Mood and affect are slightly depressed, fatigued.   Cranial nerves: Pupils are equal . Extraocular movements  in vertical and horizontal planes intact and without nystagmus. Visual fields by finger perimetry are intact. Hearing to finger rub intact.   Facial sensation intact to fine touch. Facial motor strength is symmetric and tongue and uvula move midline. Shoulder shrug is equal.  Motor exam: Normal tone ,muscle bulk and symmetric ,strength in all extremities. Strong grip, able to provide good elbow movements, wrist .  Assessment:  After physical and neurologic examination, review of laboratory studies, imaging, neurophysiology testing and pre-existing records, assessment is:   OSA , 28 AHI, reduced to AHI  1.2 on auto CPAP, dream station, 4-10 cm water .  Solution of insomnia, sleep apnea, snoring, and nocturia,  Continuous improvement of excessive daytime sleepiness and fatigue. Improvement also noted for Post nasal drip and chronic sphenoid sinusitis   The patient was advised of the nature of the diagnosed sleep disorder , the treatment options and risks for general a health and wellness arising from not treating the condition. Visit duration was  15 minutes, face to face time was pent at 60% of this time  to answer all questions and provide a discussion and education on how  to improve sleep quality and surroundings for the patient.   Plan:  Treatment plan and additional workup : Rv in 12 month with new compliance download , her  DME is [employer].     Asencion Partridge Shamari Trostel MD  11/26/2016   Cc ;  Carmie Kanner , MD

## 2017-11-25 ENCOUNTER — Encounter: Payer: Self-pay | Admitting: Neurology

## 2017-11-26 ENCOUNTER — Ambulatory Visit (INDEPENDENT_AMBULATORY_CARE_PROVIDER_SITE_OTHER): Payer: BC Managed Care – PPO | Admitting: Adult Health

## 2017-11-26 ENCOUNTER — Encounter: Payer: Self-pay | Admitting: Adult Health

## 2017-11-26 VITALS — BP 142/87 | HR 64 | Wt 242.8 lb

## 2017-11-26 DIAGNOSIS — G4733 Obstructive sleep apnea (adult) (pediatric): Secondary | ICD-10-CM | POA: Diagnosis not present

## 2017-11-26 DIAGNOSIS — Z9989 Dependence on other enabling machines and devices: Secondary | ICD-10-CM | POA: Diagnosis not present

## 2017-11-26 NOTE — Progress Notes (Signed)
PATIENT: Marie Rocha DOB: 1965/12/08  REASON FOR VISIT: follow up HISTORY FROM: patient  HISTORY OF PRESENT ILLNESS: Today 11/26/17 Marie Rocha is a 53 year old female with a history of obstructive sleep apnea on CPAP.  She returns today for compliance only.  Her download indicates that she use her machine 28 out of 30 days for compliance of 93.3%.  She use her machine greater than 4 hours 76.7%.  Her residual AHI is 2.4.  She reports here lately she has been going to bed but waking up at 4 AM.  She is unsure why she is waking up and unable to go back to sleep.  Otherwise she feels that the CPAP is working well for her.  She returns today for an evaluation.  HISTORY 11/26/2016, I had the pleasure of seeing Marie Rocha again today, who was Epworth Sleepiness Scale is meanwhile endorsed at only 7 points fatigue severity reduced to 19 points. She has continued to use CPAP in the treatment of obstructive sleep apnea, has used CPAP since April 2016.  She experienced air leaks. Average AHI has been low, her 95th percentile pressure remains close to 10 cm water. Compliance has been 90% 4 days a 70% for over 4 hours of nightly use. AHI is 1.9. There is some room for improvement she reports that she sometimes holds inadvertently and unconsciously the mask off at night and wakes up with her gear next to her.   REVIEW OF SYSTEMS: Out of a complete 14 system review of symptoms, the patient complains only of the following symptoms, and all other reviewed systems are negative.  See HPI  ALLERGIES: No Known Allergies  HOME MEDICATIONS: Outpatient Medications Prior to Visit  Medication Sig Dispense Refill  . amLODipine (NORVASC) 5 MG tablet Take 5 mg by mouth daily. Reported on 10/19/2015    . calcium carbonate 200 MG capsule Take by mouth daily.    . fluticasone (FLONASE) 50 MCG/ACT nasal spray Place 1 spray into both nostrils daily as needed.     Marland Kitchen olmesartan (BENICAR) 20 MG tablet Take 20 mg  by mouth daily.  11  . cyclobenzaprine (FLEXERIL) 10 MG tablet Take 1 tablet (10 mg total) by mouth at bedtime. 30 tablet 3  . valsartan (DIOVAN) 160 MG tablet Take 160 mg by mouth daily.  6   No facility-administered medications prior to visit.     PAST MEDICAL HISTORY: Past Medical History:  Diagnosis Date  . Allergy    seasonal  . Hypertension   . Primary snoring 12/02/2014    PAST SURGICAL HISTORY: Past Surgical History:  Procedure Laterality Date  . CHOLECYSTECTOMY  1995   lap choli  . DIAGNOSTIC LAPAROSCOPY     exploratory-  . MASS EXCISION  06/02/2012   Procedure: EXCISION MASS;  Surgeon: Wynonia Sours, MD;  Location: [facility];  Service: Orthopedics;  Laterality: Left;  Excision mass left thumb    FAMILY HISTORY: Family History  Problem Relation Age of Onset  . Hypertension Father   . Prostate cancer Father   . Lymphoma Father   . Hyperlipidemia Father   . Other Father        pacemaker  . Irregular heart beat Mother   . Hyperlipidemia Mother   . Depression Mother   . Alzheimer's disease Mother   . Dementia Mother   . Hypertension Sister   . Heart attack Unknown        Grandfather  . Colon cancer Neg  Hx     SOCIAL HISTORY: Social History   Socioeconomic History  . Marital status: Married    Spouse name: Elberta Fortis  . Number of children: 4  . Years of education: Masters  . Highest education level: Not on file  Social Needs  . Financial resource strain: Not on file  . Food insecurity - worry: Not on file  . Food insecurity - inability: Not on file  . Transportation needs - medical: Not on file  . Transportation needs - non-medical: Not on file  Occupational History  . Occupation: Sales executive: self employeed    Comment: Temporary  Tobacco Use  . Smoking status: Never Smoker  . Smokeless tobacco: Never Used  Substance and Sexual Activity  . Alcohol use: Yes    Alcohol/week: 0.0 oz    Comment: occasionaly  . Drug  use: No  . Sexual activity: Not on file  Other Topics Concern  . Not on file  Social History Narrative   Patient is married Elberta Fortis)   Patient has four children.   Patient has a Scientist, water quality.   Patient drinks one caffeine drink per week.         PHYSICAL EXAM  Vitals:   11/26/17 1256  BP: (!) 142/87  Pulse: 64  Weight: 242 lb 12.8 oz (110.1 kg)   Body mass index is 38.03 kg/m.  Generalized: Well developed, in no acute distress   Neurological examination  Mentation: Alert oriented to time, place, history taking. Follows all commands speech and language fluent Cranial nerve II-XII: Pupils were equal round reactive to light. Extraocular movements were full, visual field were full on confrontational test. Facial sensation and strength were normal. Uvula tongue midline. Head turning and shoulder shrug  were normal and symmetric. Motor: The motor testing reveals 5 over 5 strength of all 4 extremities. Good symmetric motor tone is noted throughout.  Sensory: Sensory testing is intact to soft touch on all 4 extremities. No evidence of extinction is noted.  Coordination: Cerebellar testing reveals good finger-nose-finger and heel-to-shin bilaterally.  Gait and station: Gait is normal. Tandem gait is normal. Romberg is negative. No drift is seen.  Reflexes: Deep tendon reflexes are symmetric and normal bilaterally.   DIAGNOSTIC DATA (LABS, IMAGING, TESTING) - I reviewed patient records, labs, notes, testing and imaging myself where available.    ASSESSMENT AND PLAN 52 y.o. year old female  has a past medical history of Allergy, Hypertension, and Primary snoring (12/02/2014). here with :  1.  Obstructive sleep apnea on CPAP  Overall the patient is doing well on CPAP.  She is encouraged to continue using the CPAP nightly greater than 4 hours each night.  I advised that she could use melatonin 3-5 mg 2 hours before bedtime for sleep.  She is advised that if her symptoms worsen or  she develops new symptoms she should let us know.  She will follow-up in 6 months or sooner if needed.  I spent 15 minutes with the patient. 50% of this time was spent reviewing CPAP download    Ward Givens, MSN, NP-C 11/26/2017, 1:13 PM [facility] [address] [place], [place] [address] [phone]

## 2017-11-26 NOTE — Patient Instructions (Signed)
Your Plan:  Continue using CPAP nightly  Can you melatonin 2 hours before bedtime 3-5 mg  If your symptoms worsen or you develop new symptoms please let us know.   Thank you for coming to see [place] at [facility]. I hope we have been able to provide you high quality care today.  You may receive a patient satisfaction survey over the next few weeks. We would appreciate your feedback and comments so that we may continue to improve ourselves and the health of our patients.

## 2017-11-26 NOTE — Progress Notes (Signed)
I agree with the assessment and plan as directed by NP .The patient is known to me .   Maevis Mumby, MD  

## 2017-12-15 ENCOUNTER — Telehealth: Payer: Self-pay | Admitting: Adult Health

## 2017-12-15 NOTE — Telephone Encounter (Signed)
I typically do not write notes for this. She was encouraged to use the CPAP > 4 hours every night. Advised that she could try melatonin because she was waking up at 4 am unable to go back to sleep.

## 2017-12-15 NOTE — Telephone Encounter (Signed)
Pt needs something for her employer.

## 2017-12-15 NOTE — Telephone Encounter (Signed)
Pt states she needs something from her employer that explains her treatment plan re: her CPAP, pt states that she made it know she still isn't fully rested and this makes it difficult for her to get up in the mornings for work.  Pt is asking if any type of letter can be prepared for her re: this matter to please call her one way or the other

## 2017-12-16 ENCOUNTER — Telehealth: Payer: Self-pay | Admitting: *Deleted

## 2017-12-16 ENCOUNTER — Telehealth: Payer: Self-pay | Admitting: Neurology

## 2017-12-16 NOTE — Telephone Encounter (Signed)
I spoke to pt and relayed per MM/NP that we do not typically write notes relating to missing work due  not being able to get to work on time because of sleeping problems. I relayed that using cpap > 4 hours nightly and taking the melatonin to get to sleep.  She states that she has tried taking the melatonin and it has not helped in this issue.  She is able to get to sleep, but awakens at 4am and not able to go back to sleep or when she does she has a hard time.   She says that she has been late for work 3-4 times this last week due to not being able to hear alarm and getting up to get to work on time.  She stated she has even tried going to sleep later like 11:30pm to see if this would help  She asked if could write note stating she is a pt of neurology clinic and was seen on 11/26/2017.  I relayed that yes we could do this.  She then stated she would contact her HR and see what it needed.  She asked for MM/NP to call her back and her mobile would be available after 1330.  She may use mychart as well.  She is needing help with this problem.

## 2017-12-16 NOTE — Telephone Encounter (Signed)
belsomra- 10 mg tab-

## 2017-12-16 NOTE — Telephone Encounter (Signed)
I called patient LVM 

## 2017-12-16 NOTE — Telephone Encounter (Signed)
Dr. Brett Fairy, any suggestions? Would you try ambein or belsomra?

## 2017-12-16 NOTE — Telephone Encounter (Signed)
FYI Pt called back to advise NP Megan that she has resolved some of what she had a concern about.  Pt is not asking for a call back, she states she will try reaching out to NP [facility] via [place]

## 2017-12-16 NOTE — Telephone Encounter (Signed)
LMVM for pt that note she requested is not normally or typically something we write for.  She is encouraged to use cpap > then 4 hours and try melatonin when goes to asleep, to hopefully help her stay asleep.  She is to call back if needed.

## 2017-12-18 ENCOUNTER — Other Ambulatory Visit: Payer: Self-pay | Admitting: Obstetrics and Gynecology

## 2017-12-18 DIAGNOSIS — R928 Other abnormal and inconclusive findings on diagnostic imaging of breast: Secondary | ICD-10-CM

## 2017-12-23 ENCOUNTER — Other Ambulatory Visit: Payer: Self-pay | Admitting: Obstetrics and Gynecology

## 2017-12-23 ENCOUNTER — Ambulatory Visit
Admission: RE | Admit: 2017-12-23 | Discharge: 2017-12-23 | Disposition: A | Discharge status: Home / Self Care | Payer: BC Managed Care – PPO | Source: Ambulatory Visit | Attending: Obstetrics and Gynecology | Admitting: Obstetrics and Gynecology

## 2017-12-23 ENCOUNTER — Ambulatory Visit
Admission: RE | Admit: 2017-12-23 | Discharge: 2017-12-23 | Disposition: A | Discharge status: Home / Self Care | Payer: BLUE CROSS/BLUE SHIELD | Source: Ambulatory Visit | Attending: Obstetrics and Gynecology | Admitting: Obstetrics and Gynecology

## 2017-12-23 DIAGNOSIS — R928 Other abnormal and inconclusive findings on diagnostic imaging of breast: Secondary | ICD-10-CM

## 2018-06-24 ENCOUNTER — Other Ambulatory Visit: Payer: Self-pay | Admitting: Obstetrics and Gynecology

## 2018-06-24 ENCOUNTER — Ambulatory Visit
Admission: RE | Admit: 2018-06-24 | Discharge: 2018-06-24 | Disposition: A | Discharge status: Home / Self Care | Payer: BLUE CROSS/BLUE SHIELD | Source: Ambulatory Visit | Attending: Obstetrics and Gynecology | Admitting: Obstetrics and Gynecology

## 2018-06-24 DIAGNOSIS — R928 Other abnormal and inconclusive findings on diagnostic imaging of breast: Secondary | ICD-10-CM

## 2018-06-25 ENCOUNTER — Other Ambulatory Visit: Payer: Self-pay | Admitting: Obstetrics and Gynecology

## 2018-06-25 DIAGNOSIS — N63 Unspecified lump in unspecified breast: Secondary | ICD-10-CM

## 2018-10-30 ENCOUNTER — Telehealth: Payer: Self-pay | Admitting: Adult Health

## 2018-10-30 DIAGNOSIS — G4733 Obstructive sleep apnea (adult) (pediatric): Secondary | ICD-10-CM

## 2018-10-30 DIAGNOSIS — Z9989 Dependence on other enabling machines and devices: Principal | ICD-10-CM

## 2018-10-30 NOTE — Telephone Encounter (Signed)
Pt states it is time for the replacing of some of her CPAP supplies, she has been having upper respiratory problems.  Pt states she has been told by [facility] there was a denial on her request.  Pt 1 year f/u is scheduled.  Pt is asking for a call from RN to discuss her need of her CPAP supplies sooner than later.

## 2018-10-30 NOTE — Telephone Encounter (Signed)
LVM informing the patient that her request for new CPAP supply order has been sent to NP. Advised her the NP is out of the office but may place order today before we close at noon. Advised patient if this RN receives order it will be sent to [place] today. Otherwise it'll be sent on Monday. Thanked her for her patience.

## 2018-11-02 ENCOUNTER — Encounter: Payer: Self-pay | Admitting: *Deleted

## 2018-11-02 NOTE — Addendum Note (Signed)
Addended by: Trudie Buckler on: 11/02/2018 07:38 AM   Modules accepted: Orders

## 2018-11-02 NOTE — Telephone Encounter (Signed)
CPAP order placed by NP and community message sent to [facility] via Epic to process order.  Informed patient through a my chart message.

## 2018-11-26 ENCOUNTER — Encounter: Payer: Self-pay | Admitting: Adult Health

## 2018-11-26 ENCOUNTER — Ambulatory Visit (INDEPENDENT_AMBULATORY_CARE_PROVIDER_SITE_OTHER): Payer: BLUE CROSS/BLUE SHIELD | Admitting: Adult Health

## 2018-11-26 VITALS — BP 128/72 | HR 78 | Ht 67.5 in | Wt 243.6 lb

## 2018-11-26 DIAGNOSIS — Z9989 Dependence on other enabling machines and devices: Secondary | ICD-10-CM

## 2018-11-26 DIAGNOSIS — G4733 Obstructive sleep apnea (adult) (pediatric): Secondary | ICD-10-CM | POA: Diagnosis not present

## 2018-11-26 NOTE — Progress Notes (Signed)
PATIENT: Marie Rocha DOB: 05-30-1966  REASON FOR VISIT: follow up HISTORY FROM: patient  HISTORY OF PRESENT ILLNESS: Today 11/26/18 Marie Rocha is a 53 year old female here for follow-up for Obstructive Sleep Apnea with CPAP use. She returns today for compliance only.  She is doing well today. Equipment is working well. She does report a few nights she has fallen asleep and not realized until morning that she forgot to use for her mask.  She reports she has changed her diet and has eliminated meats. Her download indicates overall sub-optimal usage at 68 % of days of > 4 hours, 90% of days used, average days used 4 hours 54 minutes. Her residual AHI is 2.1 and average total Leak 25.3. She feels her CPAP is working well for her. Overall, she feels her CPAP is doing well. Epworth Sleepiness Scale is 9.    History: Marie Rocha is a 53 year old female with a history of obstructive sleep apnea on CPAP.  She returns today for compliance only.  Her download indicates that she use her machine 28 out of 30 days for compliance of 93.3%.  She use her machine greater than 4 hours 76.7%.  Her residual AHI is 2.4.  She reports here lately she has been going to bed but waking up at 4 AM.  She is unsure why she is waking up and unable to go back to sleep. Otherwise she feels that the CPAP is working well for her.  She returns today for an evaluation.   REVIEW OF SYSTEMS: Out of a complete 14 system review of symptoms, the patient complains only of the following symptoms, and all other reviewed systems are negative.  ALLERGIES: No Known Allergies  HOME MEDICATIONS: Outpatient Medications Prior to Visit  Medication Sig Dispense Refill  . amLODipine (NORVASC) 5 MG tablet Take 5 mg by mouth daily. Reported on 10/19/2015    . calcium carbonate 200 MG capsule Take by mouth daily.    . fluticasone (FLONASE) 50 MCG/ACT nasal spray Place 1 spray into both nostrils daily as needed.     Marland Kitchen olmesartan (BENICAR)  20 MG tablet Take 20 mg by mouth daily.  11   No facility-administered medications prior to visit.     PAST MEDICAL HISTORY: Past Medical History:  Diagnosis Date  . Allergy    seasonal  . Hypertension   . OSA on CPAP   . Primary snoring 12/02/2014    PAST SURGICAL HISTORY: Past Surgical History:  Procedure Laterality Date  . CHOLECYSTECTOMY  1995   lap choli  . DIAGNOSTIC LAPAROSCOPY     exploratory-  . MASS EXCISION  06/02/2012   Procedure: EXCISION MASS;  Surgeon: Wynonia Sours, MD;  Location: [facility];  Service: Orthopedics;  Laterality: Left;  Excision mass left thumb    FAMILY HISTORY: Family History  Problem Relation Age of Onset  . Hypertension Father   . Prostate cancer Father   . Lymphoma Father   . Hyperlipidemia Father   . Other Father        pacemaker  . Irregular heart beat Mother   . Hyperlipidemia Mother   . Depression Mother   . Alzheimer's disease Mother   . Dementia Mother   . Hypertension Sister   . Heart attack Other        Grandfather  . Colon cancer Neg Hx     SOCIAL HISTORY: Social History   Socioeconomic History  . Marital status: Married  Spouse name: Elberta Fortis  . Number of children: 4  . Years of education: Masters  . Highest education level: Not on file  Occupational History  . Occupation: Sales executive: self employeed    Comment: Temporary  Social Needs  . Financial resource strain: Not on file  . Food insecurity:    Worry: Not on file    Inability: Not on file  . Transportation needs:    Medical: Not on file    Non-medical: Not on file  Tobacco Use  . Smoking status: Never Smoker  . Smokeless tobacco: Never Used  Substance and Sexual Activity  . Alcohol use: Yes    Comment: occasionaly  . Drug use: No  . Sexual activity: Not on file  Lifestyle  . Physical activity:    Days per week: Not on file    Minutes per session: Not on file  . Stress: Not on file  Relationships  . Social  connections:    Talks on phone: Not on file    Gets together: Not on file    Attends religious service: Not on file    Active member of club or organization: Not on file    Attends meetings of clubs or organizations: Not on file    Relationship status: Not on file  . Intimate partner violence:    Fear of current or ex partner: Not on file    Emotionally abused: Not on file    Physically abused: Not on file    Forced sexual activity: Not on file  Other Topics Concern  . Not on file  Social History Narrative   Patient is married Elberta Fortis)   Patient has four children.   Patient has a Scientist, water quality.   Patient drinks one caffeine drink per week.         PHYSICAL EXAM  Vitals:   11/26/18 1500  BP: 128/72  Pulse: 78  Weight: 243 lb 9.6 oz (110.5 kg)  Height: 5' 7.5" (1.715 m)   Body mass index is 37.59 kg/m.  Generalized: Well developed, in no acute distress  Neck circumference is 15, Mallampati is 4 + Neurological examination  Mentation: Alert oriented to time, place, history taking. Follows all commands speech and language fluent Cranial nerve II-XII: Pupils were equal round reactive to light. Extraocular movements were full, visual field were full on confrontational test. Facial sensation and strength were normal. Uvula tongue midline. Motor: The motor testing reveals 5 over 5 strength of all 4 extremities. Good symmetric motor tone is noted throughout.  Sensory: Sensory testing is intact to soft touch on all 4 extremities. No evidence of extinction is noted.  Coordination: Cerebellar testing reveals good finger-nose-finger and heel-to-shin bilaterally.  Gait and station: Gait is normal.  Reflexes: Deep tendon reflexes are symmetric and normal bilaterally.   DIAGNOSTIC DATA (LABS, IMAGING, TESTING) - I reviewed patient records, labs, notes, testing and imaging myself where available.  Lab Results  Component Value Date   HGB 13.8 06/02/2012      Component Value  Date/Time   NA 140 05/28/2012 1030   K 4.1 05/28/2012 1030   CL 101 05/28/2012 1030   CO2 30 05/28/2012 1030   GLUCOSE 96 05/28/2012 1030   BUN 10 05/28/2012 1030   CREATININE 0.72 05/28/2012 1030   CALCIUM 9.3 05/28/2012 1030   GFRNONAA >90 05/28/2012 1030   GFRAA >90 05/28/2012 1030   No results found for: CHOL, HDL, LDLCALC, LDLDIRECT, TRIG, CHOLHDL No results found  for: HGBA1C No results found for: VITAMINB12 No results found for: TSH    ASSESSMENT AND PLAN 53 y.o. year old female  has a past medical history of Allergy, Hypertension, OSA on CPAP, and Primary snoring (12/02/2014). here with:   1. Obstructive Sleep Apnea on CPAP  The patients download is suboptimal. She reports three times she fell asleep and forgot to wear her CPAP. She was encouraged to use CPAP every night for > 4 hours. If her symptoms worsen or she develops new symptoms she will contact our office. She will follow-up in 1 year or sooner if needed.   I spent 15 minutes with the patient. 50% of this time was spent discussing her plan of care and reviewing her CPAP download.    Butler Denmark, AGNP-C, DNP 11/26/2018, 3:16 PM [facility] [address] [place], [place] [address] [phone]

## 2018-12-29 ENCOUNTER — Other Ambulatory Visit: Payer: BLUE CROSS/BLUE SHIELD

## 2018-12-30 ENCOUNTER — Ambulatory Visit
Admission: RE | Admit: 2018-12-30 | Discharge: 2018-12-30 | Disposition: A | Discharge status: Home / Self Care | Payer: BLUE CROSS/BLUE SHIELD | Source: Ambulatory Visit | Attending: Obstetrics and Gynecology | Admitting: Obstetrics and Gynecology

## 2018-12-30 DIAGNOSIS — N63 Unspecified lump in unspecified breast: Secondary | ICD-10-CM

## 2019-05-31 ENCOUNTER — Encounter: Payer: Self-pay | Admitting: Internal Medicine

## 2019-06-26 IMAGING — US ULTRASOUND RIGHT BREAST LIMITED
1 series · 5 of 5 positions shown · non-contrast
Comparison: Previous exam(s).

CLINICAL DATA: Short-term follow-up for a probably benign breast
mass, most likely a complicated cyst, initially evaluated in
November 2017.

EXAM:
DIGITAL DIAGNOSTIC BILATERAL MAMMOGRAM WITH CAD AND TOMO
ULTRASOUND RIGHT BREAST

[Series 1: ultrasound right breast limited · 0.06mm/px · 5 of 5 slices shown]
[im 1/5]
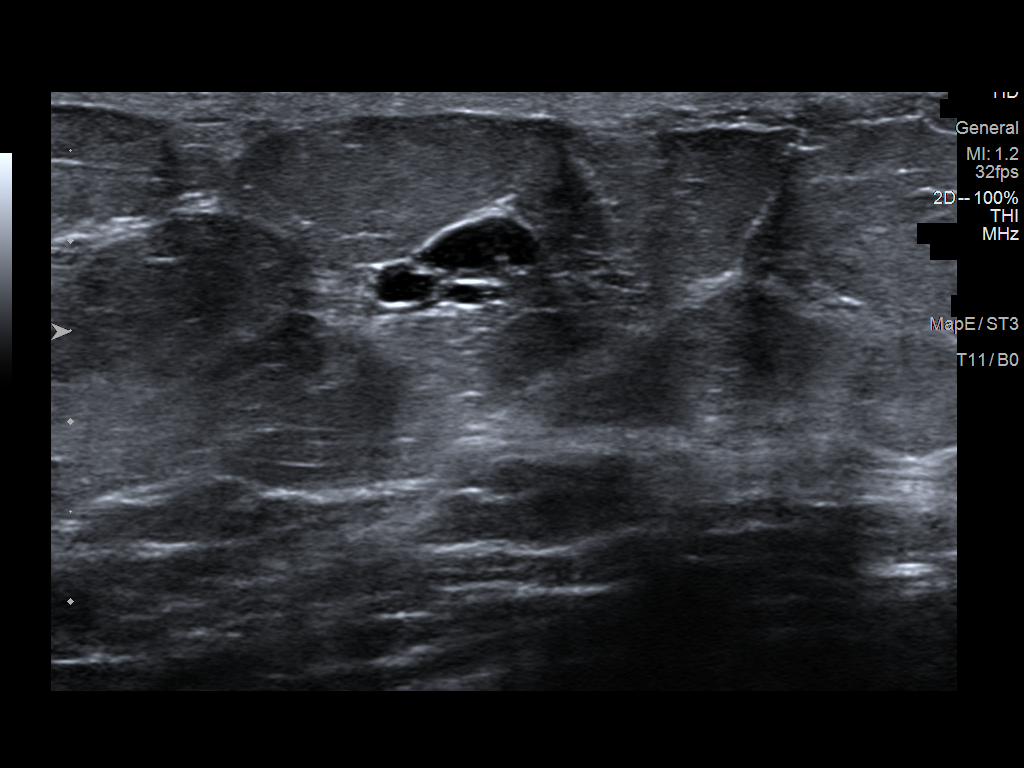
[im 2/5]
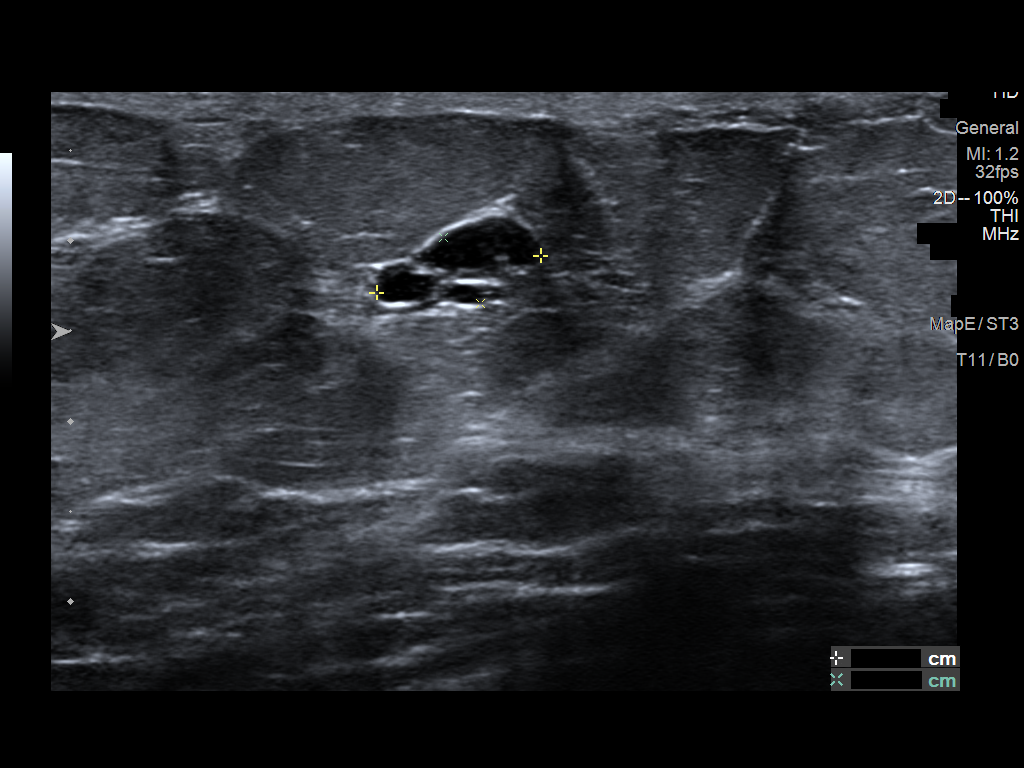
[im 3/5]
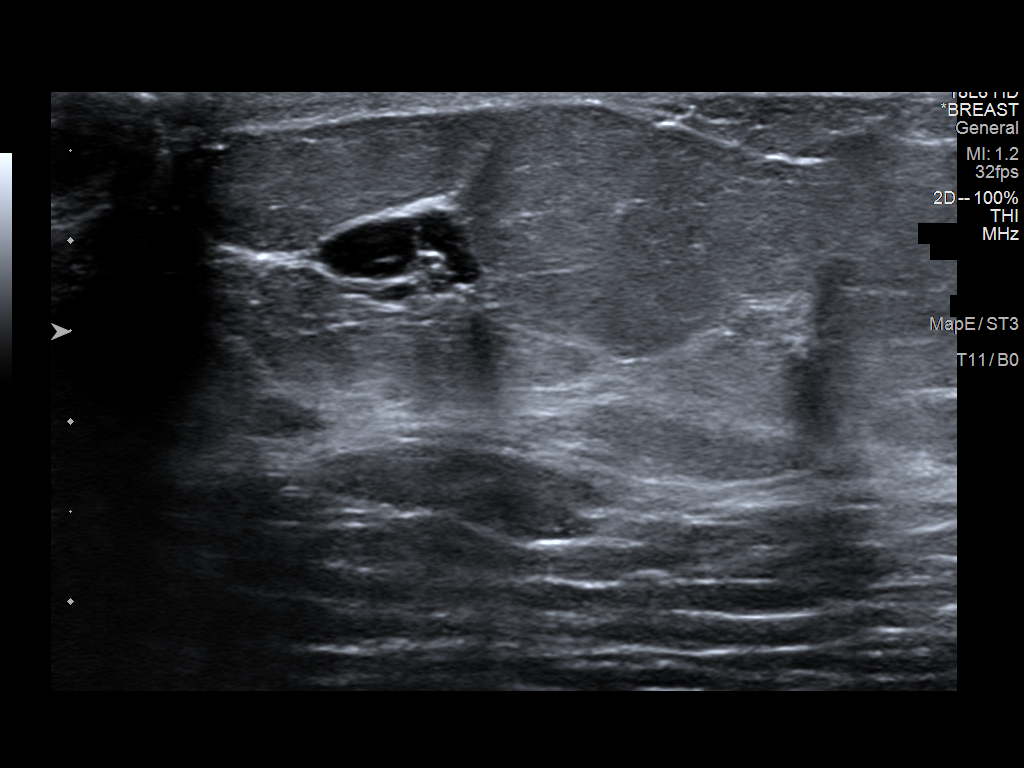
[im 4/5]
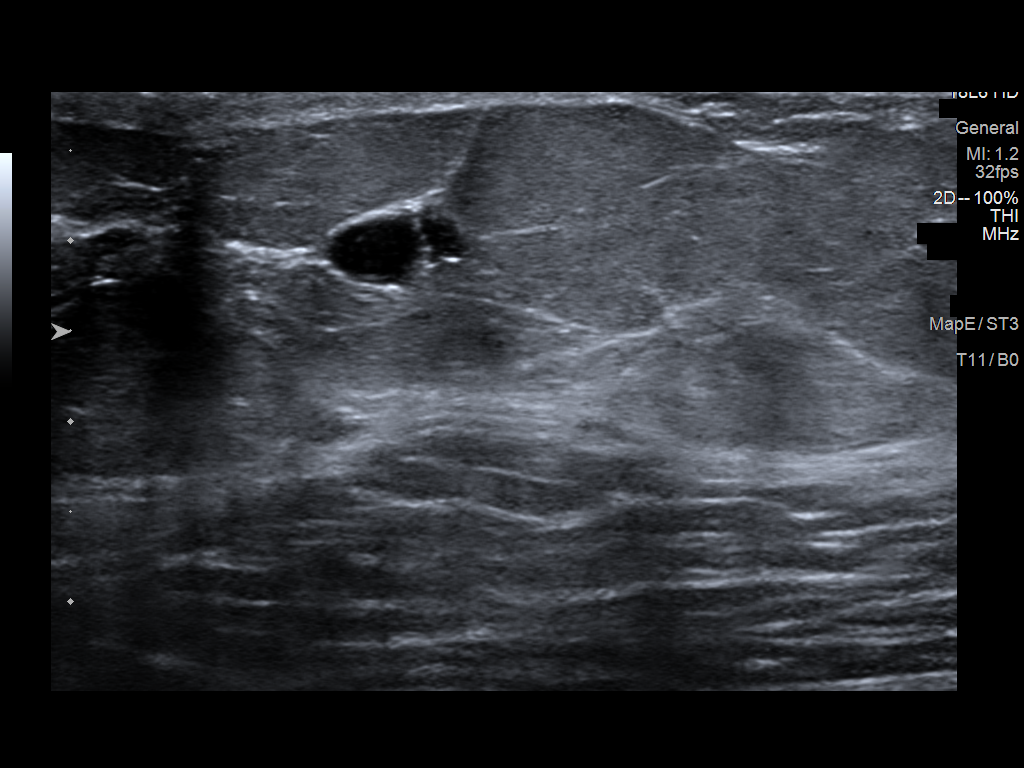
[im 5/5]
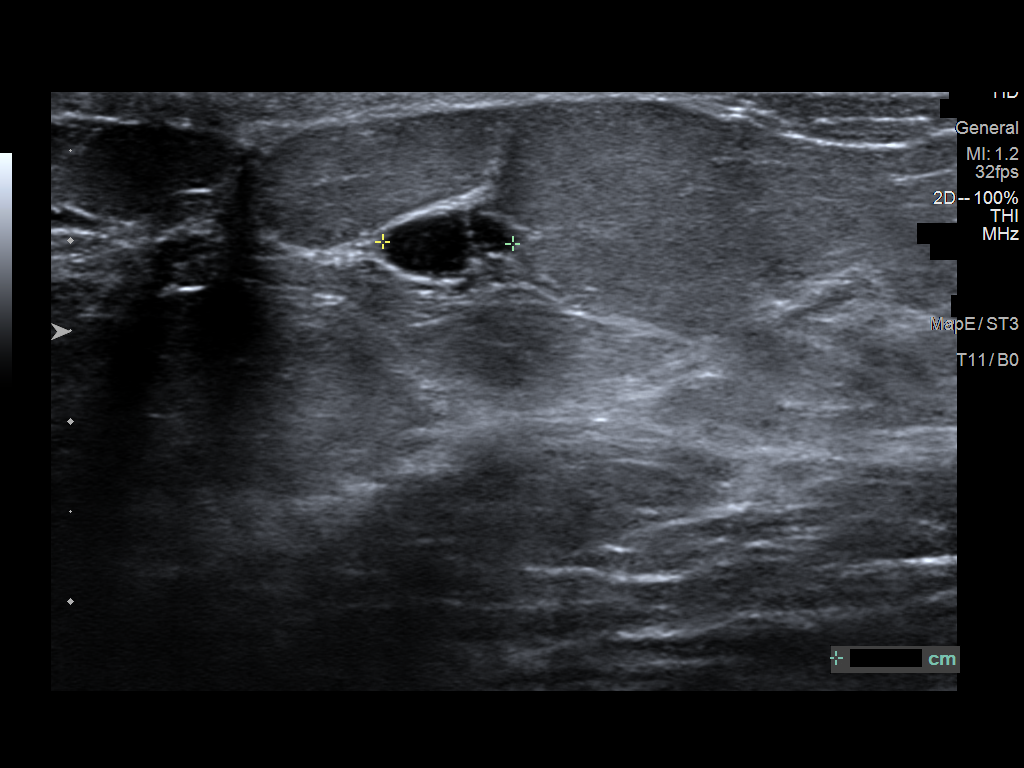

[5 of 5 positions shown; findings below may reference images not displayed]

ACR Breast Density Category b: There are scattered areas of
fibroglandular density.
FINDINGS: The circumscribed mass anterior right breast is unchanged.

There are no new breast masses, no areas of architectural
distortion, no significant areas of asymmetry and no suspicious
calcifications.

Mammographic images were processed with CAD.

Targeted ultrasound is performed, showing a septated cyst in the
right breast at 2 o'clock, 1 cm from the nipple, measuring 9 x 4 x 7
mm, unchanged from the prior exams.
IMPRESSION: 1. Probably benign mass, consistent with a complicated/septated
cyst, in the right breast, stable for 1 year. No new abnormalities.

RECOMMENDATION:
Diagnostic mammography and right breast ultrasound in 1 year to
provide 2 years of stability for the probably benign right breast
mass.

I have discussed the findings and recommendations with the patient.
Results were also provided in writing at the conclusion of the
visit. If applicable, a reminder letter will be sent to the patient
regarding the next appointment.

BI-RADS CATEGORY  3: Probably benign.

## 2019-06-26 IMAGING — [facility] DIGITAL DIAGNOSTIC BILATERAL MAMMOGRAM WITH TOMO AND CAD
8 series · 8 of 24 positions shown · non-contrast
Comparison: Previous exam(s).

CLINICAL DATA: Short-term follow-up for a probably benign breast
mass, most likely a complicated cyst, initially evaluated in
November 2017.

EXAM:
DIGITAL DIAGNOSTIC BILATERAL MAMMOGRAM WITH CAD AND TOMO
ULTRASOUND RIGHT BREAST

[R CC synth-2D]
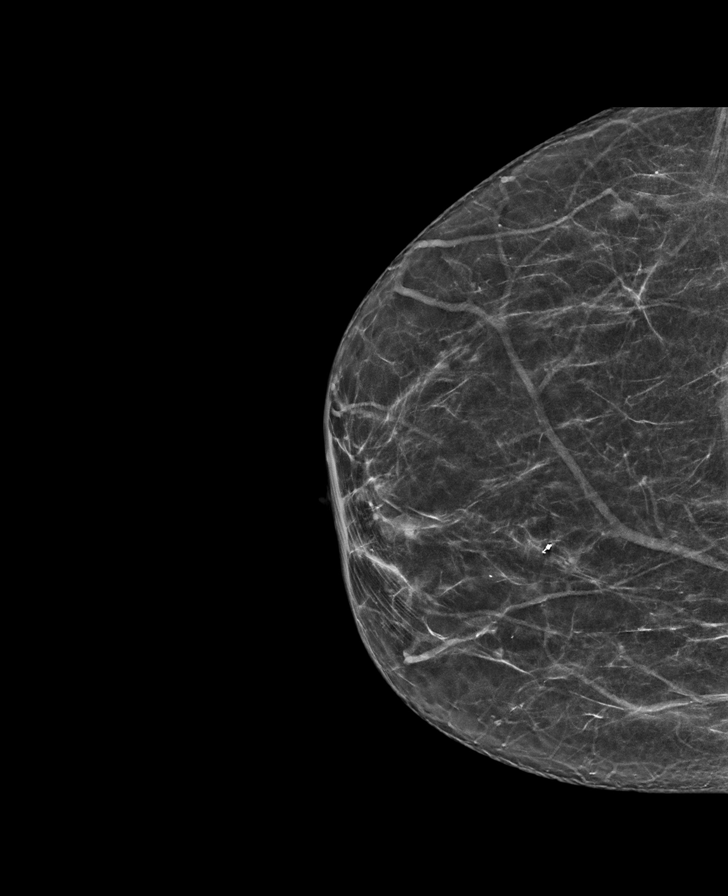

[L CC synth-2D]
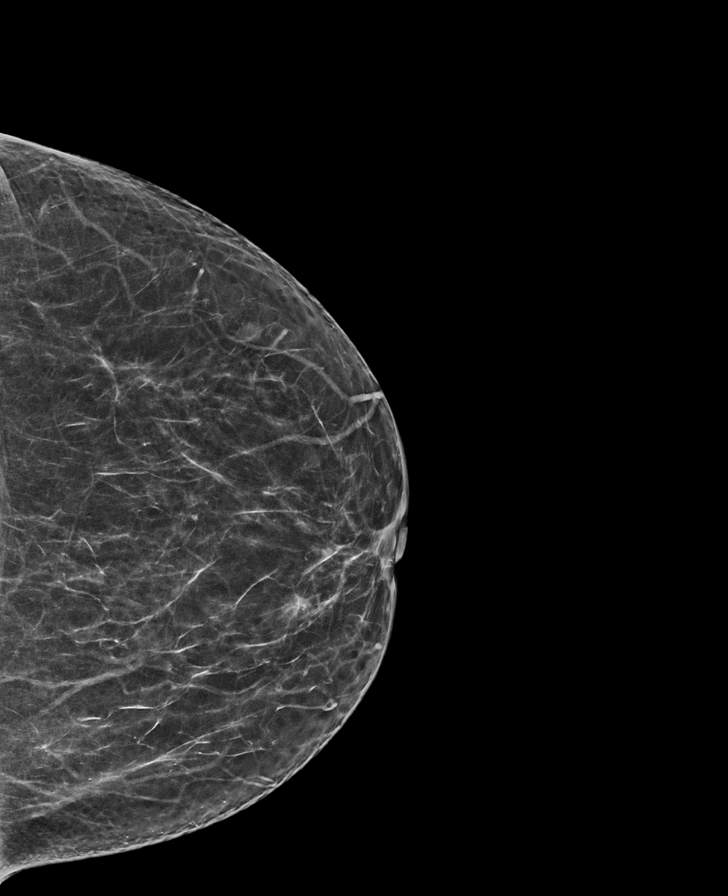

[L MLO synth-2D]
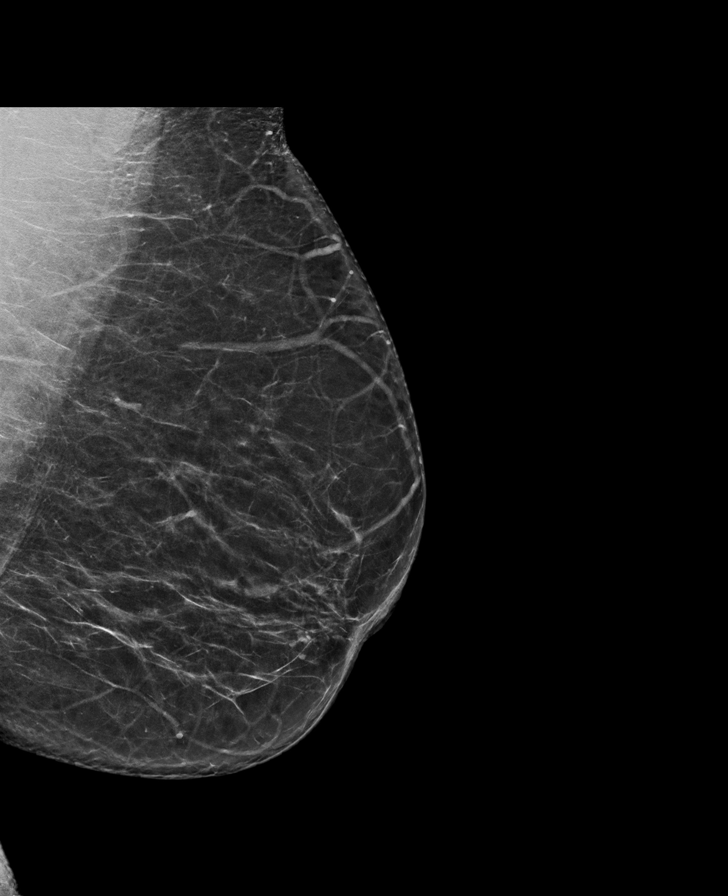

[R MLO synth-2D]
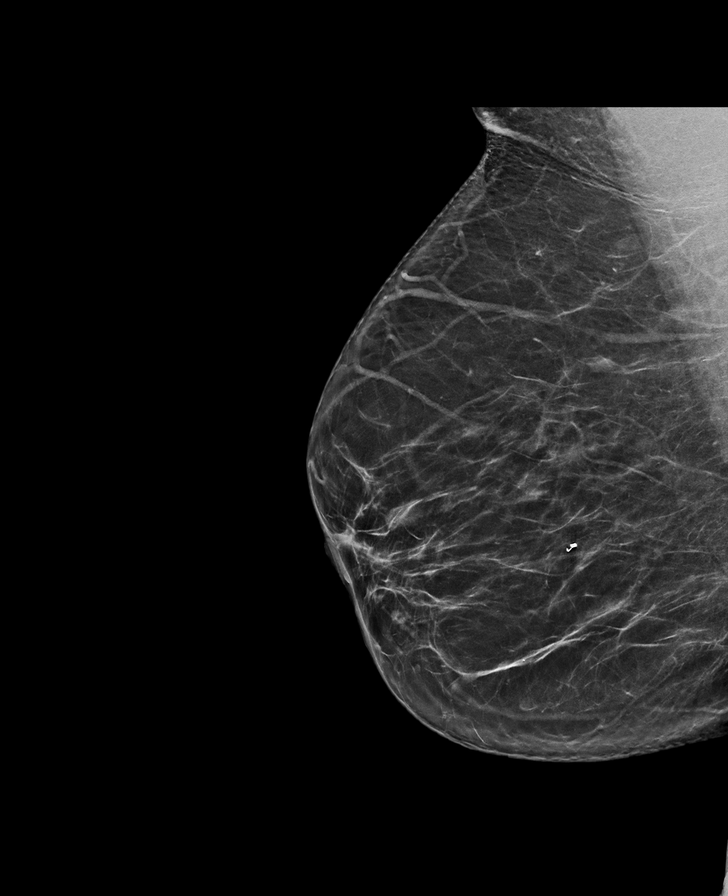

[R MLO tomo · tomo slice 37/72.0]
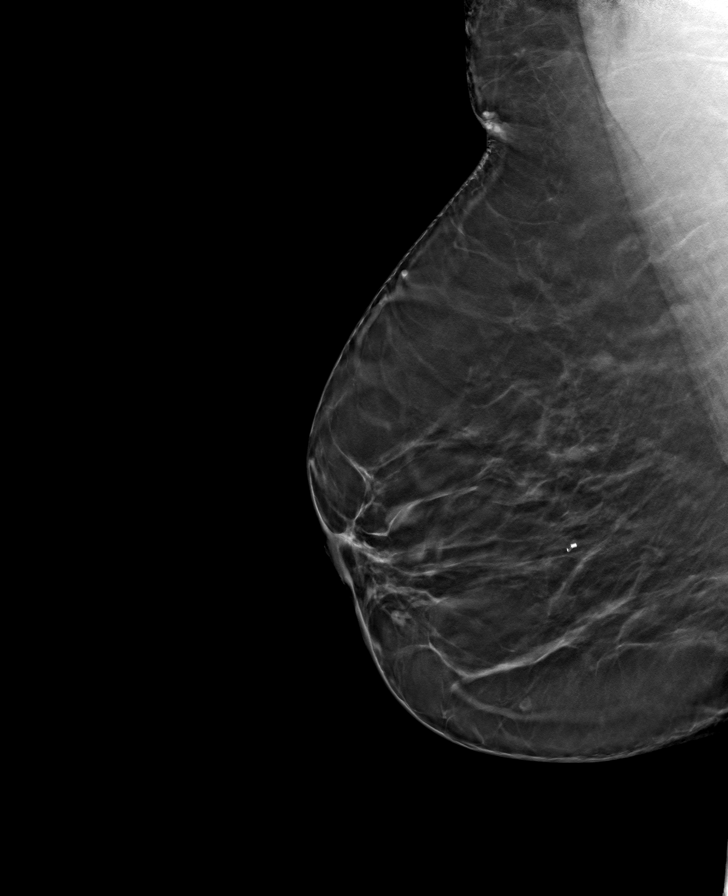

[L MLO tomo · tomo slice 37/73.0]
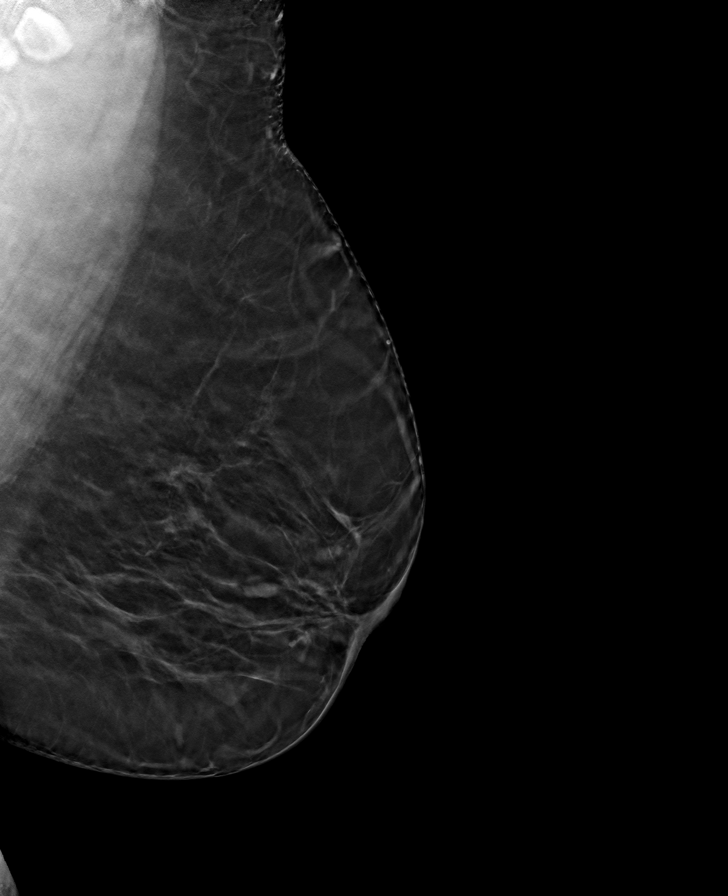

[L CC tomo · tomo slice 33/65.0]
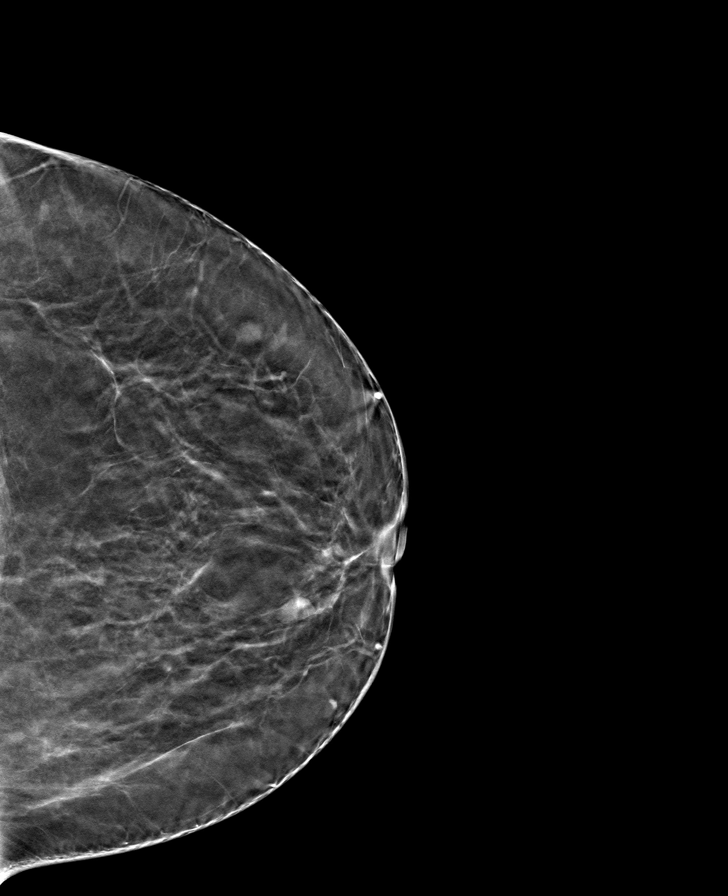

[R CC tomo · tomo slice 33/66.0]
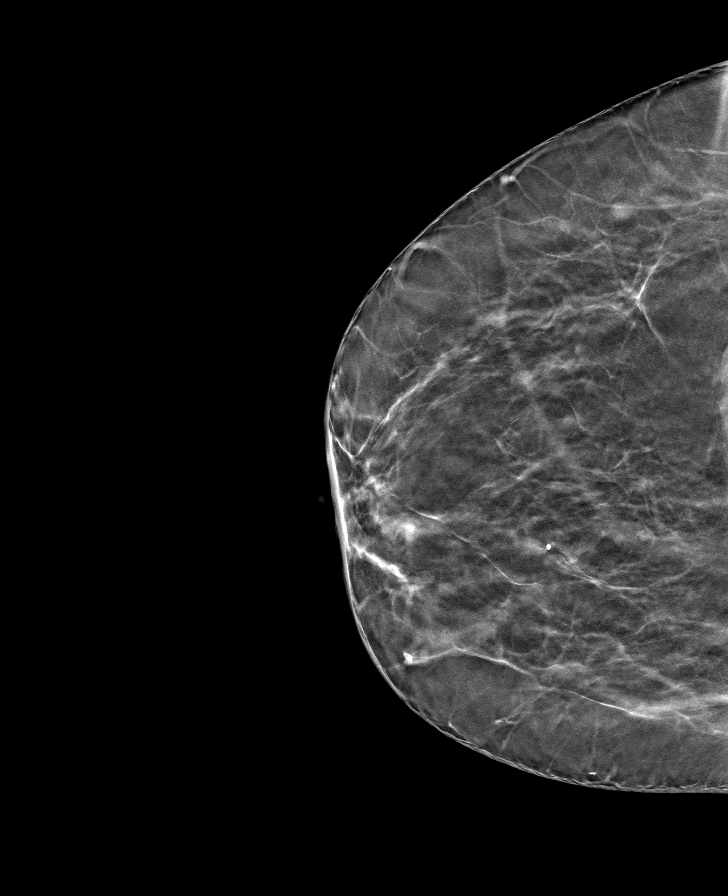

[8 of 24 positions shown; findings below may reference images not displayed]

ACR Breast Density Category b: There are scattered areas of
fibroglandular density.
FINDINGS: The circumscribed mass anterior right breast is unchanged.

There are no new breast masses, no areas of architectural
distortion, no significant areas of asymmetry and no suspicious
calcifications.

Mammographic images were processed with CAD.

Targeted ultrasound is performed, showing a septated cyst in the
right breast at 2 o'clock, 1 cm from the nipple, measuring 9 x 4 x 7
mm, unchanged from the prior exams.
IMPRESSION: 1. Probably benign mass, consistent with a complicated/septated
cyst, in the right breast, stable for 1 year. No new abnormalities.

RECOMMENDATION:
Diagnostic mammography and right breast ultrasound in 1 year to
provide 2 years of stability for the probably benign right breast
mass.

I have discussed the findings and recommendations with the patient.
Results were also provided in writing at the conclusion of the
visit. If applicable, a reminder letter will be sent to the patient
regarding the next appointment.

BI-RADS CATEGORY  3: Probably benign.

## 2019-08-21 ENCOUNTER — Other Ambulatory Visit: Payer: Self-pay

## 2019-08-21 DIAGNOSIS — Z20822 Contact with and (suspected) exposure to covid-19: Secondary | ICD-10-CM

## 2019-08-22 LAB — NOVEL CORONAVIRUS, NAA: SARS-CoV-2, NAA: NOT DETECTED

## 2019-10-08 ENCOUNTER — Other Ambulatory Visit: Payer: Self-pay

## 2019-10-08 DIAGNOSIS — Z20822 Contact with and (suspected) exposure to covid-19: Secondary | ICD-10-CM

## 2019-10-10 LAB — NOVEL CORONAVIRUS, NAA: SARS-CoV-2, NAA: NOT DETECTED

## 2019-11-14 NOTE — Progress Notes (Signed)
Cardiology Office Note:   Date:  11/16/2019  NAME:  Marie Rocha    MRN: [id number] DOB:  03/09/1966   PCP:  Marton Redwood, MD  Cardiologist:  No primary care provider on file.   Referring MD: Marton Redwood, MD   Chief Complaint  Patient presents with  . Abnormal ECG   History of Present Illness:   Marie Rocha is a 54 y.o. female with a hx of hypertension, OSA who is being seen today for the evaluation of abnormal EKG/chest pain/shortness of breath at the request of Marton Redwood, MD.  EKG obtained at primary care physician office on 11/03/2019.  There was concerns for a prior septal infarct versus incorrectly placement.  She reports was evaluated by her primary care physician earlier this month.  She began to start an exercise regimen that included walking on an exercise bike.  She use the exercise bike for 20 minutes and after became short of breath and lightheaded.  She had a headache as well.  She also reports a soreness in her center chest since that time.  The soreness in her chest was worsened by palpation but is apparently improved with remaining active.  She still does get quite short of breath with minimal activity but has not resumed any rigorous physical activity.  She reports the symptoms of shortness of breath have gotten a bit better but again she is not exerting herself.  She reports that she can get short of breath with climbing a flight of stairs or doing activities such as walking around her house.  She endorses that the chest pain seems to have gotten better to.  The episode appeared to last for several minutes after her exercise session and got better.  Her EKG demonstrates a possible old anteroseptal infarct versus obesity artifact.  Her cardiovascular disease risk factors include hypertension hyperlipidemia and obesity.  She has no strong family history of cardiovascular disease.  She is a never smoker.  She reports no lower extremity edema, orthopnea, PND.  She is no  evidence of volume overload on examination today.  Past Medical History: Past Medical History:  Diagnosis Date  . Allergy    seasonal  . Hyperlipidemia   . Hypertension   . OSA on CPAP   . Primary snoring 12/02/2014    Past Surgical History: Past Surgical History:  Procedure Laterality Date  . CHOLECYSTECTOMY  1995   lap choli  . DIAGNOSTIC LAPAROSCOPY     exploratory-  . MASS EXCISION  06/02/2012   Procedure: EXCISION MASS;  Surgeon: Wynonia Sours, MD;  Location: [place];  Service: Orthopedics;  Laterality: Left;  Excision mass left thumb    Current Medications: Current Meds  Medication Sig  . amLODipine (NORVASC) 5 MG tablet Take 5 mg by mouth daily. Reported on 10/19/2015  . calcium carbonate 200 MG capsule Take by mouth daily.  . Cholecalciferol (VITAMIN D) 125 MCG (5000 UT) CAPS Take by mouth daily.  . fluticasone (FLONASE) 50 MCG/ACT nasal spray Place 1 spray into both nostrils daily as needed.   Marland Kitchen olmesartan (BENICAR) 20 MG tablet Take 20 mg by mouth daily.  . potassium chloride (KLOR-CON) 10 MEQ tablet Take 10 mEq by mouth daily.     Allergies:    Patient has no known allergies.   Social History: Social History   Socioeconomic History  . Marital status: Married    Spouse name: Elberta Fortis  . Number of children: 4  . Years of  education: Masters  . Highest education level: Not on file  Occupational History  . Occupation: Sales executive: self employeed    Comment: Temporary  Tobacco Use  . Smoking status: Never Smoker  . Smokeless tobacco: Never Used  Substance and Sexual Activity  . Alcohol use: Not Currently    Comment: occasionaly  . Drug use: No  . Sexual activity: Not on file  Other Topics Concern  . Not on file  Social History Narrative   Patient is married Elberta Fortis)   Patient has four children.   Patient has a Scientist, water quality.   Patient drinks one caffeine drink per week.      Social Determinants of Health    Financial Resource Strain:   . Difficulty of Paying Living Expenses: Not on file  Food Insecurity:   . Worried About Charity fundraiser in the Last Year: Not on file  . Ran Out of Food in the Last Year: Not on file  Transportation Needs:   . Lack of Transportation (Medical): Not on file  . Lack of Transportation (Non-Medical): Not on file  Physical Activity:   . Days of Exercise per Week: Not on file  . Minutes of Exercise per Session: Not on file  Stress:   . Feeling of Stress : Not on file  Social Connections:   . Frequency of Communication with Friends and Family: Not on file  . Frequency of Social Gatherings with Friends and Family: Not on file  . Attends Religious Services: Not on file  . Active Member of Clubs or Organizations: Not on file  . Attends Archivist Meetings: Not on file  . Marital Status: Not on file     Family History: The patient's family history includes Alzheimer's disease in her mother; Dementia in her mother; Depression in her mother; Heart attack in an other family member; Heart disease in her mother; Hyperlipidemia in her father and mother; Hypertension in her father and sister; Irregular heart beat in her mother; Lymphoma in her father; Other in her father; Prostate cancer in her father. There is no history of Colon cancer.  ROS:   All other ROS reviewed and negative. Pertinent positives noted in the HPI.     EKGs/Labs/Other Studies Reviewed:   The following studies were personally reviewed by me today:  EKG:  EKG is ordered today.  The ekg ordered today demonstrates primary care sinus bradycardia, heart rate 55, incomplete right bundle branch block, left axis deviation noted, no evidence of prior infarction, and was personally reviewed by me.   Recent Labs: No results found for requested labs within last 8760 hours.   Recent Lipid Panel No results found for: CHOL, TRIG, HDL, CHOLHDL, VLDL, LDLCALC, LDLDIRECT  Physical Exam:   VS:  BP  (!) 142/87   Pulse (!) 55   Ht 5' 7.5" (1.715 m)   Wt 244 lb (110.7 kg)   SpO2 98%   BMI 37.65 kg/m    Wt Readings from Last 3 Encounters:  11/16/19 244 lb (110.7 kg)  11/26/18 243 lb 9.6 oz (110.5 kg)  11/26/17 242 lb 12.8 oz (110.1 kg)    General: Obese female no acute distress Heart: Atraumatic, normal size  Eyes: PEERLA, EOMI  Neck: Supple, no JVD Endocrine: No thryomegaly Cardiac: Normal S1, S2; RRR; no murmurs, rubs, or gallops Lungs: Clear to auscultation bilaterally, no wheezing, rhonchi or rales  Abd: Soft, nontender, no hepatomegaly  Ext: No edema, pulses 2+ Musculoskeletal:  No deformities, BUE and BLE strength normal and equal Skin: Warm and dry, no rashes   Neuro: Alert and oriented to person, place, time, and situation, CNII-XII grossly intact, no focal deficits  Psych: Normal mood and affect   ASSESSMENT:   Marie Rocha is a 54 y.o. female who presents for the following: 1. Nonspecific abnormal electrocardiogram (ECG) (EKG)   2. SOB (shortness of breath)   3. Chest pain of uncertain etiology   4. Essential hypertension     PLAN:   1. Nonspecific abnormal electrocardiogram (ECG) (EKG) 2. SOB (shortness of breath) 3. Chest pain of uncertain etiology -Her EKG demonstrates a possible old anteroseptal infarct versus obesity artifact.  I do not think this represents an incomplete right bundle branch block.  Her symptoms of chest pain are atypical.  They likely are musculoskeletal.  Her shortness of breath I suspect is related to deconditioning. -Nonetheless given her significant CVD risk factors which include hypertension, obesity, hyperlipidemia I think she merits further evaluation.  We will order a TSH, BNP, echocardiogram today.  We will need to exclude any structural heart disease with this. -Given the chest pain that occurred after exertion I think it is prudent to exclude obstructive CAD.  We will proceed with a cardiac CTA.  She will take 50 mg of  metoprolol tartrate 2 hours before scan.  She will also obtain a BMP 1 week before that scan as well.  I think it is safe her to resume light exercise in the interim.  After we have the above testing we will release her back to rigorous physical activity.  She should also continue to modify her diet and try to lose weight.  4. Essential hypertension -We will continue current blood pressure medications.   Disposition: Return in about 3 months (around 02/14/2020).  Medication Adjustments/Labs and Tests Ordered: Current medicines are reviewed at length with the patient today.  Concerns regarding medicines are outlined above.  Orders Placed This Encounter  Procedures  . CT CORONARY MORPH W/CTA COR W/SCORE W/CA W/CM &/OR WO/CM  . CT CORONARY FRACTIONAL FLOW RESERVE DATA PREP  . CT CORONARY FRACTIONAL FLOW RESERVE FLUID ANALYSIS  . Basic metabolic panel  . Brain natriuretic peptide  . TSH  . EKG 12-Lead  . ECHOCARDIOGRAM COMPLETE   Meds ordered this encounter  Medications  . metoprolol tartrate (LOPRESSOR) 50 MG tablet    Sig: Take 1 tablet by mouth once for procedure.    Dispense:  1 tablet    Refill:  0    Patient Instructions  Medication Instructions:  Take Metoprolol 50 mg two hours before CT. *If you need a refill on your cardiac medications before your next appointment, please call your pharmacy*  Lab Work: TSH, BNP today. BMET one week before CT. If you have labs (blood work) drawn today and your tests are completely normal, you will receive your results only by: Marland Kitchen MyChart Message (if you have MyChart) OR . A paper copy in the mail If you have any lab test that is abnormal or we need to change your treatment, we will call you to review the results.  Testing/Procedures: Your physician has requested that you have cardiac CT. Cardiac computed tomography (CT) is a painless test that uses an x-ray machine to take clear, detailed pictures of your heart. For further information  please visit HugeFiesta.tn. Please follow instruction sheet as given.  Echocardiogram - Your physician has requested that you have an echocardiogram. Echocardiography is a painless  test that uses sound waves to create images of your heart. It provides your doctor with information about the size and shape of your heart and how well your heart's chambers and valves are working. This procedure takes approximately one hour. There are no restrictions for this procedure. This will be performed at our [facility] location - [address], [address].  Follow-Up: At [facility], you and your health needs are our priority.  As part of our continuing mission to provide you with exceptional heart care, we have created designated Provider Care Teams.  These Care Teams include your primary Cardiologist (physician) and Advanced Practice Providers (APPs -  Physician Assistants and Nurse Practitioners) who all work together to provide you with the care you need, when you need it.  Your next appointment:   3 month(s)  The format for your next appointment:   Virtual Visit   Provider:   Eleonore Chiquito, MD  Other Instructions Your cardiac CT will be scheduled at one of the below locations:   [facility] [address] [place], [place] [address] [phone]  If scheduled at [facility], please arrive at the [facility] main entrance of [facility] 30-45 minutes prior to test start time. Proceed to the [facility] Radiology Department (first floor) to check-in and test prep.  Please follow these instructions carefully (unless otherwise directed):  Hold all erectile dysfunction medications at least 3 days (72 hrs) prior to test.  On the Night Before the Test: . Be sure to Drink plenty of water. . Do not consume any caffeinated/decaffeinated beverages or chocolate 12 hours prior to your test. . Do not take any antihistamines 12 hours prior to your test.  On the Day  of the Test: . Drink plenty of water. Do not drink any water within one hour of the test. . Do not eat any food 4 hours prior to the test. . You may take your regular medications prior to the test.  . Take metoprolol (Lopressor) two hours prior to test. . HOLD Furosemide/Hydrochlorothiazide morning of the test. . FEMALES- please wear underwire-free bra if available  After the Test: . Drink plenty of water. . After receiving IV contrast, you may experience a mild flushed feeling. This is normal. . On occasion, you may experience a mild rash up to 24 hours after the test. This is not dangerous. If this occurs, you can take Benadryl 25 mg and increase your fluid intake. . If you experience trouble breathing, this can be serious. If it is severe call 911 IMMEDIATELY. If it is mild, please call our office. . If you take any of these medications: Glipizide/Metformin, Avandament, Glucavance, please do not take 48 hours after completing test unless otherwise instructed.   Once we have confirmed authorization from your insurance company, we will call you to set up a date and time for your test.   For non-scheduling related questions, please contact the cardiac imaging nurse navigator should you have any questions/concerns: [name], RN Navigator Cardiac Imaging [facility] [phone] Office        Signed, Addison Naegeli. Audie Box, [place]  [address] [place], [place] [address] [phone]  11/16/2019 12:12 PM

## 2019-11-16 ENCOUNTER — Encounter: Payer: Self-pay | Admitting: Cardiovascular Disease

## 2019-11-16 ENCOUNTER — Other Ambulatory Visit: Payer: Self-pay

## 2019-11-16 ENCOUNTER — Ambulatory Visit (INDEPENDENT_AMBULATORY_CARE_PROVIDER_SITE_OTHER): Payer: BC Managed Care – PPO | Admitting: Cardiovascular Disease

## 2019-11-16 VITALS — BP 142/87 | HR 55 | Ht 67.5 in | Wt 244.0 lb

## 2019-11-16 DIAGNOSIS — R9431 Abnormal electrocardiogram [ECG] [EKG]: Secondary | ICD-10-CM

## 2019-11-16 DIAGNOSIS — R079 Chest pain, unspecified: Secondary | ICD-10-CM

## 2019-11-16 DIAGNOSIS — I10 Essential (primary) hypertension: Secondary | ICD-10-CM

## 2019-11-16 DIAGNOSIS — R0602 Shortness of breath: Secondary | ICD-10-CM

## 2019-11-16 MED ORDER — METOPROLOL TARTRATE 50 MG PO TABS: ORAL_TABLET | ORAL | 0 refills | Status: DC

## 2019-11-16 NOTE — Patient Instructions (Addendum)
Medication Instructions:  Take Metoprolol 50 mg two hours before CT. *If you need a refill on your cardiac medications before your next appointment, please call your pharmacy*  Lab Work: TSH, BNP today. BMET one week before CT. If you have labs (blood work) drawn today and your tests are completely normal, you will receive your results only by: Marland Kitchen MyChart Message (if you have MyChart) OR . A paper copy in the mail If you have any lab test that is abnormal or we need to change your treatment, we will call you to review the results.  Testing/Procedures: Your physician has requested that you have cardiac CT. Cardiac computed tomography (CT) is a painless test that uses an x-ray machine to take clear, detailed pictures of your heart. For further information please visit HugeFiesta.tn. Please follow instruction sheet as given.  Echocardiogram - Your physician has requested that you have an echocardiogram. Echocardiography is a painless test that uses sound waves to create images of your heart. It provides your doctor with information about the size and shape of your heart and how well your heart's chambers and valves are working. This procedure takes approximately one hour. There are no restrictions for this procedure. This will be performed at our [facility] location - [address].  Follow-Up: At [facility], you and your health needs are our priority.  As part of our continuing mission to provide you with exceptional heart care, we have created designated Provider Care Teams.  These Care Teams include your primary Cardiologist (physician) and Advanced Practice Providers (APPs -  Physician Assistants and Nurse Practitioners) who all work together to provide you with the care you need, when you need it.  Your next appointment:   3 month(s)  The format for your next appointment:   Virtual Visit   Provider:   Eleonore Chiquito, MD  Other Instructions Your cardiac CT will be  scheduled at one of the below locations:   [facility] [address] [place], [place] [address] [phone]  If scheduled at [facility], please arrive at the [facility] main entrance of [facility] 30-45 minutes prior to test start time. Proceed to the [facility] Radiology Department (first floor) to check-in and test prep.  Please follow these instructions carefully (unless otherwise directed):  Hold all erectile dysfunction medications at least 3 days (72 hrs) prior to test.  On the Night Before the Test: . Be sure to Drink plenty of water. . Do not consume any caffeinated/decaffeinated beverages or chocolate 12 hours prior to your test. . Do not take any antihistamines 12 hours prior to your test.  On the Day of the Test: . Drink plenty of water. Do not drink any water within one hour of the test. . Do not eat any food 4 hours prior to the test. . You may take your regular medications prior to the test.  . Take metoprolol (Lopressor) two hours prior to test. . HOLD Furosemide/Hydrochlorothiazide morning of the test. . FEMALES- please wear underwire-free bra if available  After the Test: . Drink plenty of water. . After receiving IV contrast, you may experience a mild flushed feeling. This is normal. . On occasion, you may experience a mild rash up to 24 hours after the test. This is not dangerous. If this occurs, you can take Benadryl 25 mg and increase your fluid intake. . If you experience trouble breathing, this can be serious. If it is severe  call 911 IMMEDIATELY. If it is mild, please call our office. . If you take any of these medications: Glipizide/Metformin, Avandament, Glucavance, please do not take 48 hours after completing test unless otherwise instructed.   Once we have confirmed authorization from your insurance company, we will call you to set up a date and time for your test.   For non-scheduling related questions, please  contact the cardiac imaging nurse navigator should you have any questions/concerns: [name], RN Navigator Cardiac Imaging [facility] [phone] Office

## 2019-11-17 LAB — TSH: TSH: 2.52 u[IU]/mL (ref 0.450–4.500)

## 2019-11-17 LAB — BRAIN NATRIURETIC PEPTIDE: BNP: 19.8 pg/mL (ref 0.0–100.0)

## 2019-11-24 ENCOUNTER — Ambulatory Visit (HOSPITAL_COMMUNITY): Payer: BC Managed Care – PPO | Attending: Cardiovascular Disease

## 2019-11-24 ENCOUNTER — Other Ambulatory Visit: Payer: Self-pay

## 2019-11-24 DIAGNOSIS — R0602 Shortness of breath: Secondary | ICD-10-CM

## 2019-11-29 ENCOUNTER — Ambulatory Visit: Payer: BLUE CROSS/BLUE SHIELD | Admitting: Adult Health

## 2019-12-01 ENCOUNTER — Telehealth: Payer: Self-pay

## 2019-12-01 NOTE — Telephone Encounter (Signed)
Contacted patient, LVM to notify that Dr.O'Neal did want this test to be completed.  Left call back number if she had questions to discuss.      Geralynn Rile, MD  Caprice Beaver, LPN  Yes, she needs this test.   [place] [address]. Audie Box, [place]  [address], [place]  [place], [place] [address]  [phone]  11:33 AM       Previous Messages   ----- Message -----  From: Caprice Beaver, LPN  Sent: [id number]  4:26 PM EST  To: Geralynn Rile, MD, Caprice Beaver, LPN  Subject: FW: cardiac CT                  Does this patient still need this test? I did not see in the ECHO results that you wanted to cancel this test.   Thank you!

## 2019-12-11 ENCOUNTER — Ambulatory Visit: Payer: BC Managed Care – PPO

## 2019-12-13 ENCOUNTER — Ambulatory Visit: Payer: BC Managed Care – PPO | Attending: Internal Medicine

## 2019-12-13 DIAGNOSIS — Z23 Encounter for immunization: Secondary | ICD-10-CM | POA: Insufficient documentation

## 2019-12-13 NOTE — Progress Notes (Signed)
   Covid-19 Vaccination Clinic  Name:  BHAWANA TKACH    MRN: [id number] DOB: [birth year]  12/13/2019  Ms. Manzella was observed post Covid-19 immunization for 15 minutes without incidence. She was provided with Vaccine Information Sheet and instruction to access the V-Safe system.   Ms. Macadams was instructed to call 911 with any severe reactions post vaccine: Marland Kitchen Difficulty breathing  . Swelling of your face and throat  . A fast heartbeat  . A bad rash all over your body  . Dizziness and weakness    Immunizations Administered    Name Date Dose VIS Date Route   Pfizer COVID-19 Vaccine 12/13/2019  5:07 PM 0.3 mL 10/08/2019 Intramuscular   Manufacturer: [employer]   Lot: [id number]   [place]: [id number]

## 2019-12-15 ENCOUNTER — Ambulatory Visit (INDEPENDENT_AMBULATORY_CARE_PROVIDER_SITE_OTHER): Payer: BC Managed Care – PPO | Admitting: Adult Health

## 2019-12-15 ENCOUNTER — Encounter: Payer: Self-pay | Admitting: Adult Health

## 2019-12-15 ENCOUNTER — Other Ambulatory Visit: Payer: Self-pay

## 2019-12-15 VITALS — BP 122/79 | HR 55 | Temp 97.9°F | Ht 67.0 in | Wt 241.0 lb

## 2019-12-15 DIAGNOSIS — G4733 Obstructive sleep apnea (adult) (pediatric): Secondary | ICD-10-CM

## 2019-12-15 DIAGNOSIS — Z9989 Dependence on other enabling machines and devices: Secondary | ICD-10-CM | POA: Diagnosis not present

## 2019-12-15 NOTE — Progress Notes (Signed)
PATIENT: Marie Rocha DOB: [birth year]  REASON FOR VISIT: follow up HISTORY FROM: patient  HISTORY OF PRESENT ILLNESS: Today 12/15/19:  [name] is a 54 year old female with a history of obstructive sleep apnea on CPAP.  She returns today for follow-up.  Her download indicates that she use her machine nightly for compliance of 100%.  She use her machine greater than 4 hours for compliance of 93.3%.  Her average AHI is 2.1 on 4 to 10 cm of water with EPR of 3.  She does not have a significant leak.  Reports that CPAP is working well for her.  Returns today for an evaluation.  HISTORY  11/26/18 [name] is a 54 year old female here for follow-up for Obstructive Sleep Apnea with CPAP use. She returns today for compliance only.  She is doing well today. Equipment is working well. She does report a few nights she has fallen asleep and not realized until morning that she forgot to use for her mask.  She reports she has changed her diet and has eliminated meats. Her download indicates overall sub-optimal usage at 68 % of days of > 4 hours, 90% of days used, average days used 4 hours 54 minutes. Her residual AHI is 2.1 and average total Leak 25.3. She feels her CPAP is working well for her. Overall, she feels her CPAP is doing well. Epworth Sleepiness Scale is 9.   REVIEW OF SYSTEMS: Out of a complete 14 system review of symptoms, the patient complains only of the following symptoms, and all other reviewed systems are negative.  Epworth sleepiness score 8, fatigue severity score 19  ALLERGIES: No Known Allergies  HOME MEDICATIONS: Outpatient Medications Prior to Visit  Medication Sig Dispense Refill  . amLODipine (NORVASC) 5 MG tablet Take 5 mg by mouth daily. Reported on 10/19/2015    . calcium carbonate 200 MG capsule Take by mouth daily.    . Cholecalciferol (VITAMIN D) 125 MCG (5000 UT) CAPS Take by mouth daily.    . fluticasone (FLONASE) 50 MCG/ACT nasal spray Place 1 spray into  both nostrils daily as needed.     Marland Kitchen olmesartan (BENICAR) 20 MG tablet Take 20 mg by mouth daily.  11  . potassium chloride (KLOR-CON) 10 MEQ tablet Take 10 mEq by mouth daily.    . metoprolol tartrate (LOPRESSOR) 50 MG tablet Take 1 tablet by mouth once for procedure. 1 tablet 0   No facility-administered medications prior to visit.    PAST MEDICAL HISTORY: Past Medical History:  Diagnosis Date  . Allergy    seasonal  . Hyperlipidemia   . Hypertension   . OSA on CPAP   . Primary snoring 12/02/2014    PAST SURGICAL HISTORY: Past Surgical History:  Procedure Laterality Date  . CHOLECYSTECTOMY  1995   lap choli  . DIAGNOSTIC LAPAROSCOPY     exploratory-  . MASS EXCISION  06/02/2012   Procedure: EXCISION MASS;  Surgeon: Wynonia Sours, MD;  Location: [facility];  Service: Orthopedics;  Laterality: Left;  Excision mass left thumb    FAMILY HISTORY: Family History  Problem Relation Age of Onset  . Hypertension Father   . Prostate cancer Father   . Lymphoma Father   . Hyperlipidemia Father   . Other Father        pacemaker  . Irregular heart beat Mother   . Hyperlipidemia Mother   . Depression Mother   . Alzheimer's disease Mother   . Dementia Mother   .  Heart disease Mother   . Hypertension Sister   . Heart attack Other        Grandfather  . Colon cancer Neg Hx     SOCIAL HISTORY: Social History   Socioeconomic History  . Marital status: Married    Spouse name: Elberta Fortis  . Number of children: 4  . Years of education: Masters  . Highest education level: Not on file  Occupational History  . Occupation: Sales executive: self employeed    Comment: Temporary  Tobacco Use  . Smoking status: Never Smoker  . Smokeless tobacco: Never Used  Substance and Sexual Activity  . Alcohol use: Not Currently    Comment: occasionaly  . Drug use: No  . Sexual activity: Not on file  Other Topics Concern  . Not on file  Social History Narrative    Patient is married Elberta Fortis)   Patient has four children.   Patient has a Scientist, water quality.   Patient drinks one caffeine drink per week.      Social Determinants of Health   Financial Resource Strain:   . Difficulty of Paying Living Expenses: Not on file  Food Insecurity:   . Worried About Charity fundraiser in the Last Year: Not on file  . Ran Out of Food in the Last Year: Not on file  Transportation Needs:   . Lack of Transportation (Medical): Not on file  . Lack of Transportation (Non-Medical): Not on file  Physical Activity:   . Days of Exercise per Week: Not on file  . Minutes of Exercise per Session: Not on file  Stress:   . Feeling of Stress : Not on file  Social Connections:   . Frequency of Communication with Friends and Family: Not on file  . Frequency of Social Gatherings with Friends and Family: Not on file  . Attends Religious Services: Not on file  . Active Member of Clubs or Organizations: Not on file  . Attends Archivist Meetings: Not on file  . Marital Status: Not on file  Intimate Partner Violence:   . Fear of Current or Ex-Partner: Not on file  . Emotionally Abused: Not on file  . Physically Abused: Not on file  . Sexually Abused: Not on file      PHYSICAL EXAM  Vitals:   12/15/19 1316  BP: 122/79  Pulse: (!) 55  Temp: 97.9 F (36.6 C)  Weight: 241 lb (109.3 kg)  Height: 5\' 7"  (1.702 m)   Body mass index is 37.75 kg/m.  Generalized: Well developed, in no acute distress  Chest: Lungs clear to auscultation bilaterally  Neurological examination  Mentation: Alert oriented to time, place, history taking. Follows all commands speech and language fluent Cranial nerve II-XII: Extraocular movements were full, visual field were full on confrontational test Head turning and shoulder shrug  were normal and symmetric. Motor: The motor testing reveals 5 over 5 strength of all 4 extremities. Good symmetric motor tone is noted throughout.    Sensory: Sensory testing is intact to soft touch on all 4 extremities. No evidence of extinction is noted.  Gait and station: Gait is normal.    DIAGNOSTIC DATA (LABS, IMAGING, TESTING) - I reviewed patient records, labs, notes, testing and imaging myself where available.  Lab Results  Component Value Date   HGB 13.8 06/02/2012      Component Value Date/Time   NA 140 05/28/2012 1030   K 4.1 05/28/2012 1030   CL 101  05/28/2012 1030   CO2 30 05/28/2012 1030   GLUCOSE 96 05/28/2012 1030   BUN 10 05/28/2012 1030   CREATININE 0.72 05/28/2012 1030   CALCIUM 9.3 05/28/2012 1030   GFRNONAA >90 05/28/2012 1030   GFRAA >90 05/28/2012 1030    Lab Results  Component Value Date   TSH 2.520 11/16/2019      ASSESSMENT AND PLAN 54 y.o. year old female  has a past medical history of Allergy, Hyperlipidemia, Hypertension, OSA on CPAP, and Primary snoring (12/02/2014). here with:  1. Obstructive sleep apnea on CPAP  Patient CPAP download shows excellent compliance and good treatment of her apnea.  She is encouraged to continue using CPAP nightly and greater than 4 hours each night.  She is advised that if her symptoms worsen or she develops new symptoms she should let us know.  She will follow-up in 1 year or sooner if needed    I spent 15 minutes with the patient. 50% of this time was spent reviewing CPAP download   Ward Givens, MSN, NP-C 12/15/2019, 1:37 PM [facility] [address], [place], [place] [address] [phone]

## 2019-12-15 NOTE — Patient Instructions (Signed)
Continue using CPAP nightly and greater than 4 hours each night °If your symptoms worsen or you develop new symptoms please let us know.  ° °

## 2020-01-04 ENCOUNTER — Ambulatory Visit: Payer: BC Managed Care – PPO | Attending: Internal Medicine

## 2020-01-04 DIAGNOSIS — Z23 Encounter for immunization: Secondary | ICD-10-CM

## 2020-01-04 NOTE — Progress Notes (Signed)
   Covid-19 Vaccination Clinic  Name:  EDENA CARANDANG    MRN: [id number] DOB: 06-09-1966  01/04/2020  Ms. Westover was observed post Covid-19 immunization for 15 minutes without incident. She was provided with Vaccine Information Sheet and instruction to access the V-Safe system.   Ms. Anctil was instructed to call 911 with any severe reactions post vaccine: Marland Kitchen Difficulty breathing  . Swelling of face and throat  . A fast heartbeat  . A bad rash all over body  . Dizziness and weakness   Immunizations Administered    Name Date Dose VIS Date Route   Pfizer COVID-19 Vaccine 01/04/2020 10:25 AM 0.3 mL 10/08/2019 Intramuscular   Manufacturer: [place]   Lot: [id number]   [place]: [id number]

## 2020-01-05 ENCOUNTER — Ambulatory Visit: Payer: BC Managed Care – PPO

## 2020-01-05 ENCOUNTER — Encounter (HOSPITAL_COMMUNITY): Payer: Self-pay

## 2020-01-06 ENCOUNTER — Telehealth (HOSPITAL_COMMUNITY): Payer: Self-pay | Admitting: Emergency Medicine

## 2020-01-06 NOTE — Telephone Encounter (Signed)
Reaching out to patient to offer assistance regarding upcoming cardiac imaging study; pt verbalizes understanding of appt date/time, parking situation and where to check in, pre-test NPO status and medications ordered, and verified current allergies; name and call back number provided for further questions should they arise Marie Bond RN [name] [phone] office [phone] cell   Requested patient have labs drawn, pt verbalized understanding.  Pt mentioned having metoprolol to take prior to scan. I requested she NOT take this as her last 2 OV HRs were 55bpm. Pt was relieved for the telephone call and information.  Marie Rocha

## 2020-01-07 ENCOUNTER — Ambulatory Visit (HOSPITAL_COMMUNITY)
Admission: RE | Admit: 2020-01-07 | Discharge: 2020-01-07 | Disposition: A | Discharge status: Home / Self Care | Payer: BC Managed Care – PPO | Source: Ambulatory Visit | Attending: Cardiovascular Disease | Admitting: Cardiovascular Disease

## 2020-01-07 ENCOUNTER — Other Ambulatory Visit: Payer: Self-pay

## 2020-01-07 DIAGNOSIS — R079 Chest pain, unspecified: Secondary | ICD-10-CM | POA: Insufficient documentation

## 2020-01-07 LAB — BASIC METABOLIC PANEL
BUN/Creatinine Ratio: 14 (ref 9–23)
BUN: 14 mg/dL (ref 6–24)
CO2: 28 mmol/L (ref 20–29)
Calcium: 9.7 mg/dL (ref 8.7–10.2)
Chloride: 98 mmol/L (ref 96–106)
Creatinine, Ser: 0.97 mg/dL (ref 0.57–1.00)
GFR calc Af Amer: 76 mL/min/{1.73_m2} (ref >59)
GFR calc non Af Amer: 65 mL/min/{1.73_m2} (ref >59)
Glucose: 106 mg/dL — ABNORMAL HIGH (ref 65–99)
Potassium: 3.9 mmol/L (ref 3.5–5.2)
Sodium: 139 mmol/L (ref 134–144)

## 2020-01-07 LAB — TSH: TSH: 2.04 u[IU]/mL (ref 0.450–4.500)

## 2020-01-07 LAB — BRAIN NATRIURETIC PEPTIDE: BNP: 9.1 pg/mL (ref 0.0–100.0)

## 2020-01-07 MED ORDER — NITROGLYCERIN 0.4 MG SL SUBL
SUBLINGUAL_TABLET | SUBLINGUAL | Status: AC
  Filled 2020-01-07: qty 2

## 2020-01-07 MED ORDER — IOHEXOL 350 MG/ML SOLN
80.0000 mL | Freq: Once | INTRAVENOUS | Status: AC
  Administered 2020-01-07: 80 mL via INTRAVENOUS

## 2020-01-07 MED ORDER — NITROGLYCERIN 0.4 MG SL SUBL
0.8000 mg | SUBLINGUAL_TABLET | Freq: Once | SUBLINGUAL | Status: AC
  Administered 2020-01-07: 0.8 mg via SUBLINGUAL

## 2020-01-10 ENCOUNTER — Other Ambulatory Visit: Payer: Self-pay

## 2020-01-10 MED ORDER — ROSUVASTATIN CALCIUM 20 MG PO TABS: 20.0000 mg | ORAL_TABLET | Freq: Every day | ORAL | 3 refills | Status: DC

## 2020-01-30 NOTE — Progress Notes (Signed)
Virtual Visit via Video Note   This visit type was conducted due to national recommendations for restrictions regarding the COVID-19 Pandemic (e.g. social distancing) in an effort to limit this patient's exposure and mitigate transmission in our community.  Due to her co-morbid illnesses, this patient is at least at moderate risk for complications without adequate follow up.  This format is felt to be most appropriate for this patient at this time.  All issues noted in this document were discussed and addressed.  A limited physical exam was performed with this format.  Please refer to the patient's chart for her consent to telehealth for [facility].   The patient was identified using 2 identifiers.  Date:  02/01/2020   ID:  Marie Rocha, DOB 1966/02/23, MRN [id number]  Patient Location: Home Provider Location: Office  PCP:  Geralynn Rile, MD  Cardiologist:  No primary care provider on file.  Evaluation Performed:  Follow-Up Visit  Chief Complaint:  CAD  History of Present Illness:    Marie Rocha is a 54 y.o. female with CAD, HTN, OSA who presents for follow-up of chest pain. Had atypical CP in January. CCTA with mild non-obstructive CAD. Started on aspirin and crestor. Tolerating her crestor without issues. Chest pain has resolved. Reports no further episodes. Being seen by wellness doctor and inquired about exercise and plan for vitamins. No issues on my end. BP well controlled. No complaints today.   Problem List 1. HTN 2. OSA 3. CAD  -CAC score 166 (96th percentile) -mild CAD in LAD/RCA (CCTA (01/07/2020)  The patient does not have symptoms concerning for COVID-19 infection (fever, chills, cough, or new shortness of breath).    Past Medical History:  Diagnosis Date  . Allergy    seasonal  . Hyperlipidemia   . Hypertension   . OSA on CPAP   . Primary snoring 12/02/2014   Past Surgical History:  Procedure Laterality Date  . CHOLECYSTECTOMY  1995   lap  choli  . DIAGNOSTIC LAPAROSCOPY     exploratory-  . MASS EXCISION  06/02/2012   Procedure: EXCISION MASS;  Surgeon: Marie Sours, MD;  Location: [facility];  Service: Orthopedics;  Laterality: Left;  Excision mass left thumb     Current Meds  Medication Sig  . amLODipine (NORVASC) 5 MG tablet Take 5 mg by mouth daily. Reported on 10/19/2015  . calcium carbonate 200 MG capsule Take by mouth daily.  . Cholecalciferol (VITAMIN D) 125 MCG (5000 UT) CAPS Take by mouth daily.  . fluticasone (FLONASE) 50 MCG/ACT nasal spray Place 1 spray into both nostrils daily as needed.   Marland Kitchen olmesartan (BENICAR) 20 MG tablet Take 20 mg by mouth daily.  . potassium chloride (KLOR-CON) 10 MEQ tablet Take 10 mEq by mouth daily.  . rosuvastatin (CRESTOR) 20 MG tablet Take 1 tablet (20 mg total) by mouth daily.     Allergies:   Patient has no known allergies.   Social History   Tobacco Use  . Smoking status: Never Smoker  . Smokeless tobacco: Never Used  Substance Use Topics  . Alcohol use: Not Currently    Comment: occasionaly  . Drug use: No     Family Hx: The patient's family history includes Alzheimer's disease in her mother; Dementia in her mother; Depression in her mother; Heart attack in an other family member; Heart disease in her mother; Hyperlipidemia in her father and mother; Hypertension in her father and sister; Irregular heart beat  in her mother; Lymphoma in her father; Other in her father; Prostate cancer in her father. There is no history of Colon cancer.  ROS:   Please see the history of present illness.     All other systems reviewed and are negative.   Prior CV studies:   The following studies were reviewed today:  TTE 11/24/2019  1. Left ventricular ejection fraction, by visual estimation, is 60 to  65%. The left ventricle has normal function. There is mildly increased  left ventricular hypertrophy.  2. The left ventricle has no regional wall motion  abnormalities.  3. Global right ventricle has normal systolic function.The right  ventricular size is normal. No increase in right ventricular wall  thickness.  4. Left atrial size was mildly dilated.  5. Right atrial size was normal.  6. The mitral valve is normal in structure. Trivial mitral valve  regurgitation. No evidence of mitral stenosis.  7. The tricuspid valve is normal in structure.  8. The tricuspid valve is normal in structure. Tricuspid valve  regurgitation is not demonstrated.  9. The aortic valve is normal in structure. Aortic valve regurgitation is  not visualized. No evidence of aortic valve sclerosis or stenosis.  10. The pulmonic valve was normal in structure. Pulmonic valve  regurgitation is not visualized.  11. Normal pulmonary artery systolic pressure.  12. The inferior vena cava is normal in size with greater than 50%  respiratory variability, suggesting right atrial pressure of 3 mmHg.   CCTA 01/07/2020 IMPRESSION: 1. Coronary calcium score of 166. This was 28 percentile for age and sex matched control.  2. Normal coronary origin with right dominance.  3. 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  4. Distal LAD myocardial bridge.  Labs/Other Tests and Data Reviewed:    EKG:  No ECG reviewed.  Recent Labs: 01/06/2020: BNP 9.1; BUN 14; Creatinine, Ser 0.97; Potassium 3.9; Sodium 139; TSH 2.040   Recent Lipid Panel No results found for: CHOL, TRIG, HDL, CHOLHDL, LDLCALC, LDLDIRECT  Wt Readings from Last 3 Encounters:  02/01/20 236 lb (107 kg)  12/15/19 241 lb (109.3 kg)  11/16/19 244 lb (110.7 kg)     Objective:    Vital Signs:  BP 129/89   Pulse 63   Ht 5\' 7"  (1.702 m)   Wt 236 lb (107 kg)   BMI 36.96 kg/m    VITAL SIGNS:  reviewed  GEN: NAD Pulm: talking full sentences, no shortness of breath Psych: normal mood/affect  ASSESSMENT & PLAN:    1. Chest pain, unspecified type 2. Coronary artery disease involving native  coronary artery of native heart without angina pectoris -non cardiac chest pain. Resolved. -Minimal CAD. No angina. Continue ASA/statin. Recheck lipid profile today by PCP. She will let us know the results.   3. Essential hypertension -continue current medications    COVID-19 Education: The signs and symptoms of COVID-19 were discussed with the patient and how to seek care for testing (follow up with PCP or arrange E-visit).  The importance of social distancing was discussed today.  Time:  Today, I have spent 25 minutes with the patient with telehealth technology discussing the above problems.     Medication Adjustments/Labs and Tests Ordered: Current medicines are reviewed at length with the patient today.  Concerns regarding medicines are outlined above.   Tests Ordered: No orders of the defined types were placed in this encounter.   Medication Changes: No orders of the defined types were placed in this encounter.  Follow Up:  In Person in 1 year(s)  Signed, Evalina Field, MD  02/01/2020 9:47 AM    [place]

## 2020-01-31 ENCOUNTER — Telehealth: Payer: Self-pay | Admitting: Cardiovascular Disease

## 2020-01-31 NOTE — Telephone Encounter (Signed)
She needs a fasting lipid. -W

## 2020-01-31 NOTE — Telephone Encounter (Signed)
New Message    Pt is calling and is wondering if she needs to have her labs done before her video appt tomorrow  She says she has a  PCP visit soon and they will want to do labs at that visit    Please advise

## 2020-01-31 NOTE — Telephone Encounter (Signed)
Contacted patient, she states that she has already ate today- but she goes tomorrow morning for labs with her PCP, but it is after appointment with Dr.O'Neal, advised her to have this lab completed with the rest of hers, and have them send it to [place]- Patient verbalized understanding.  Will notify MD.

## 2020-02-01 ENCOUNTER — Encounter: Payer: Self-pay | Admitting: Cardiovascular Disease

## 2020-02-01 ENCOUNTER — Telehealth (INDEPENDENT_AMBULATORY_CARE_PROVIDER_SITE_OTHER): Payer: BC Managed Care – PPO | Admitting: Cardiovascular Disease

## 2020-02-01 VITALS — BP 129/89 | HR 63 | Ht 67.0 in | Wt 236.0 lb

## 2020-02-01 DIAGNOSIS — I10 Essential (primary) hypertension: Secondary | ICD-10-CM

## 2020-02-01 DIAGNOSIS — I2510 Atherosclerotic heart disease of native coronary artery without angina pectoris: Secondary | ICD-10-CM

## 2020-02-01 DIAGNOSIS — R079 Chest pain, unspecified: Secondary | ICD-10-CM

## 2020-02-01 NOTE — Patient Instructions (Signed)

## 2020-03-13 ENCOUNTER — Other Ambulatory Visit: Payer: Self-pay | Admitting: Obstetrics and Gynecology

## 2020-03-13 DIAGNOSIS — N6310 Unspecified lump in the right breast, unspecified quadrant: Secondary | ICD-10-CM

## 2020-03-14 ENCOUNTER — Encounter: Payer: Self-pay | Admitting: Internal Medicine

## 2020-03-28 ENCOUNTER — Ambulatory Visit
Admission: RE | Admit: 2020-03-28 | Discharge: 2020-03-28 | Disposition: A | Discharge status: Home / Self Care | Payer: BC Managed Care – PPO | Source: Ambulatory Visit | Attending: Obstetrics and Gynecology | Admitting: Obstetrics and Gynecology

## 2020-03-28 ENCOUNTER — Other Ambulatory Visit: Payer: Self-pay

## 2020-03-28 DIAGNOSIS — N6310 Unspecified lump in the right breast, unspecified quadrant: Secondary | ICD-10-CM

## 2020-05-10 ENCOUNTER — Other Ambulatory Visit: Payer: Self-pay

## 2020-05-10 ENCOUNTER — Ambulatory Visit (AMBULATORY_SURGERY_CENTER): Payer: Self-pay

## 2020-05-10 VITALS — Ht 67.0 in | Wt 231.0 lb

## 2020-05-10 DIAGNOSIS — Z86010 Personal history of colonic polyps: Secondary | ICD-10-CM

## 2020-05-10 MED ORDER — SUTAB 1479-225-188 MG PO TABS: 1.0000 | ORAL_TABLET | ORAL | 0 refills | Status: DC

## 2020-05-10 NOTE — Progress Notes (Signed)
No egg or soy allergy known to patient  No issues with past sedation with any surgeries or procedures no intubation problems in the past  No diet pills per patient No home 02 use per patient  No blood thinners per patient  Pt denies issues with constipation  No A fib or A flutter   COVID 19 guidelines implemented in PV today   Coupon given to pt in [facility] today  COVID vaccine completed on 12/2019 per pt.  Due to the COVID-19 pandemic we are asking patients to follow these guidelines. Please only bring one care partner. Please be aware that your care partner may wait in the car in the parking lot or if they feel like they will be too hot to wait in the car, they may wait in the lobby on the 4th floor. All care partners are required to wear a mask the entire time (we do not have any that we can provide them), they need to practice social distancing, and we will do a Covid check for all patient's and care partners when you arrive. Also we will check their temperature and your temperature. If the care partner waits in their car they need to stay in the parking lot the entire time and we will call them on their cell phone when the patient is ready for discharge so they can bring the car to the front of the building. Also all patient's will need to wear a mask into building.

## 2020-05-12 ENCOUNTER — Encounter: Payer: Self-pay | Admitting: Internal Medicine

## 2020-05-23 ENCOUNTER — Other Ambulatory Visit: Payer: Self-pay

## 2020-05-23 ENCOUNTER — Encounter: Payer: Self-pay | Admitting: Internal Medicine

## 2020-05-23 ENCOUNTER — Ambulatory Visit (AMBULATORY_SURGERY_CENTER): Payer: BC Managed Care – PPO | Admitting: Internal Medicine

## 2020-05-23 VITALS — BP 138/76 | HR 48 | Temp 98.2°F | Resp 14 | Ht 67.0 in | Wt 231.0 lb

## 2020-05-23 DIAGNOSIS — Z86010 Personal history of colonic polyps: Secondary | ICD-10-CM

## 2020-05-23 MED ORDER — SODIUM CHLORIDE 0.9 % IV SOLN: 500.0000 mL | Freq: Once | INTRAVENOUS | Status: DC

## 2020-05-23 NOTE — Progress Notes (Signed)
Report to PACU, RN, vss, BBS= Clear.  

## 2020-05-23 NOTE — Patient Instructions (Signed)
YOU HAD AN ENDOSCOPIC PROCEDURE TODAY AT THE [facility]:   Refer to the procedure report that was given to you for any specific questions about what was found during the examination.  If the procedure report does not answer your questions, please call your gastroenterologist to clarify.  If you requested that your care partner not be given the details of your procedure findings, then the procedure report has been included in a sealed envelope for you to review at your convenience later.  YOU SHOULD EXPECT: Some feelings of bloating in the abdomen. Passage of more gas than usual.  Walking can help get rid of the air that was put into your GI tract during the procedure and reduce the bloating. If you had a lower endoscopy (such as a colonoscopy or flexible sigmoidoscopy) you may notice spotting of blood in your stool or on the toilet paper. If you underwent a bowel prep for your procedure, you may not have a normal bowel movement for a few days.  Please Note:  You might notice some irritation and congestion in your nose or some drainage.  This is from the oxygen used during your procedure.  There is no need for concern and it should clear up in a day or so.  SYMPTOMS TO REPORT IMMEDIATELY:   Following lower endoscopy (colonoscopy or flexible sigmoidoscopy):  Excessive amounts of blood in the stool  Significant tenderness or worsening of abdominal pains  Swelling of the abdomen that is new, acute  Fever of 100F or higher  For urgent or emergent issues, a gastroenterologist can be reached at any hour by calling [phone]. Do not use MyChart messaging for urgent concerns.    DIET:  We do recommend a small meal at first, but then you may proceed to your regular diet.  Drink plenty of fluids but you should avoid alcoholic beverages for 24 hours.  ACTIVITY:  You should plan to take it easy for the rest of today and you should NOT DRIVE or use heavy machinery until tomorrow (because  of the sedation medicines used during the test).    FOLLOW UP: Our staff will call the number listed on your records 48-72 hours following your procedure to check on you and address any questions or concerns that you may have regarding the information given to you following your procedure. If we do not reach you, we will leave a message.  We will attempt to reach you two times.  During this call, we will ask if you have developed any symptoms of COVID 19. If you develop any symptoms (ie: fever, flu-like symptoms, shortness of breath, cough etc.) before then, please call [phone].  If you test positive for Covid 19 in the 2 weeks post procedure, please call and report this information to us.    If any biopsies were taken you will be contacted by phone or by letter within the next 1-3 weeks.  Please call us at [phone] if you have not heard about the biopsies in 3 weeks.    SIGNATURES/CONFIDENTIALITY: You and/or your care partner have signed paperwork which will be entered into your electronic medical record.  These signatures attest to the fact that that the information above on your After Visit Summary has been reviewed and is understood.  Full responsibility of the confidentiality of this discharge information lies with you and/or your care-partner. 

## 2020-05-23 NOTE — Op Note (Signed)
[facility] Patient Name: Marie Rocha Procedure Date: 05/23/2020 8:18 AM MRN: [id number] Endoscopist: Docia Chuck. Henrene Pastor , MD Age: 54 Referring MD:  Date of Birth: 10/10/1966 Gender: Female Account #: [id number] Procedure:                Colonoscopy Indications:              High risk colon cancer surveillance: Personal                            history of non-advanced adenoma. Index exam 05-2014 Medicines:                Monitored Anesthesia Care Procedure:                Pre-Anesthesia Assessment:                           - Prior to the procedure, a History and Physical                            was performed, and patient medications and                            allergies were reviewed. The patient's tolerance of                            previous anesthesia was also reviewed. The risks                            and benefits of the procedure and the sedation                            options and risks were discussed with the patient.                            All questions were answered, and informed consent                            was obtained. Prior Anticoagulants: The patient has                            taken no previous anticoagulant or antiplatelet                            agents. ASA Grade Assessment: II - A patient with                            mild systemic disease. After reviewing the risks                            and benefits, the patient was deemed in                            satisfactory condition to undergo the procedure.  After obtaining informed consent, the colonoscope                            was passed under direct vision. Throughout the                            procedure, the patient's blood pressure, pulse, and                            oxygen saturations were monitored continuously. The                            Colonoscope was introduced through the anus and                            advanced to the  the cecum, identified by                            appendiceal orifice and ileocecal valve. The                            ileocecal valve, appendiceal orifice, and rectum                            were photographed. The quality of the bowel                            preparation was excellent. The colonoscopy was                            performed without difficulty. The patient tolerated                            the procedure well. The bowel preparation used was                            SUPREP via split dose instruction. Scope In: 8:23:55 AM Scope Out: 8:35:10 AM Scope Withdrawal Time: 0 hours 8 minutes 34 seconds  Total Procedure Duration: 0 hours 11 minutes 15 seconds  Findings:                 A few small-mouthed diverticula were found in the                            sigmoid colon.                           The exam was otherwise without abnormality on                            direct and retroflexion views. Complications:            No immediate complications. Estimated blood loss:  None. Estimated Blood Loss:     Estimated blood loss: none. Impression:               - Diverticulosis in the sigmoid colon.                           - The examination was otherwise normal on direct                            and retroflexion views.                           - No specimens collected. Recommendation:           - Repeat colonoscopy in 10 years for surveillance.                           - Patient has a contact number available for                            emergencies. The signs and symptoms of potential                            delayed complications were discussed with the                            patient. Return to normal activities tomorrow.                            Written discharge instructions were provided to the                            patient.                           - Resume previous diet.                           - Continue  present medications. Docia Chuck. Henrene Pastor, MD 05/23/2020 8:50:52 AM This report has been signed electronically.

## 2020-05-23 NOTE — Progress Notes (Signed)
Pt's states no medical or surgical changes since previsit or office visit.   V/S-CW  Check-in-JB 

## 2020-05-25 ENCOUNTER — Telehealth: Payer: Self-pay

## 2020-05-25 NOTE — Telephone Encounter (Signed)
1st attempt follow up post op call left voice message

## 2020-06-23 NOTE — Telephone Encounter (Signed)
No answer

## 2020-09-03 ENCOUNTER — Encounter: Payer: Self-pay | Admitting: Adult Health

## 2020-12-19 ENCOUNTER — Telehealth: Payer: Self-pay

## 2020-12-19 NOTE — Telephone Encounter (Signed)
LVM, not able to locate cpap data, not sure if pt needs a card DL

## 2020-12-20 ENCOUNTER — Telehealth (INDEPENDENT_AMBULATORY_CARE_PROVIDER_SITE_OTHER): Payer: BC Managed Care – PPO | Admitting: Adult Health

## 2020-12-20 DIAGNOSIS — Z9989 Dependence on other enabling machines and devices: Secondary | ICD-10-CM

## 2020-12-20 DIAGNOSIS — G4733 Obstructive sleep apnea (adult) (pediatric): Secondary | ICD-10-CM

## 2020-12-20 NOTE — Progress Notes (Signed)
PATIENT: Marie Rocha DOB: 07-25-1966  REASON FOR VISIT: follow up HISTORY FROM: patient  Virtual Visit via Video Note  I connected with [name] on 12/20/20 at 11:00 AM EST by a video enabled telemedicine application located remotely at [facility] and verified that I am speaking with the correct person using two identifiers who was located at their own home.   I discussed the limitations of evaluation and management by telemedicine and the availability of in person appointments. The patient expressed understanding and agreed to proceed.   PATIENT: Marie Rocha DOB: 05-Jul-1966  REASON FOR VISIT: follow up HISTORY FROM: patient  HISTORY OF PRESENT ILLNESS: Today 12/20/20:  Marie Rocha is a 55 year old female with a history of obstructive sleep apnea on CPAP.  We were unable to obtain a wireless today.  Patient states that she has been using the machine nightly.  She does have one of the recall machines but reports that has not been left in heat and she has not used the so clean or ozone cleaner on the machine.  She has been using the machine but is concerned about the potential risks.  HISTORY 12/15/19:  Marie Rocha is a 55 year old female with a history of obstructive sleep apnea on CPAP.  She returns today for follow-up.  Her download indicates that she use her machine nightly for compliance of 100%.  She use her machine greater than 4 hours for compliance of 93.3%.  Her average AHI is 2.1 on 4 to 10 cm of water with EPR of 3.  She does not have a significant leak.  Reports that CPAP is working well for her.  Returns today for an evaluation.  REVIEW OF SYSTEMS: Out of a complete 14 system review of symptoms, the patient complains only of the following symptoms, and all other reviewed systems are negative.  See HPI  ALLERGIES: No Known Allergies  HOME MEDICATIONS: Outpatient Medications Prior to Visit  Medication Sig Dispense Refill  .  amLODipine (NORVASC) 5 MG tablet Take 5 mg by mouth daily. Reported on 10/19/2015    . ASPIRIN 81 PO Take 1 tablet by mouth daily.    . calcium carbonate 200 MG capsule Take by mouth daily.    . Cholecalciferol (VITAMIN D) 125 MCG (5000 UT) CAPS Take by mouth daily.    . fluticasone (FLONASE) 50 MCG/ACT nasal spray Place 1 spray into both nostrils daily as needed.     Marland Kitchen olmesartan (BENICAR) 20 MG tablet Take 20 mg by mouth daily.  11  . potassium chloride (KLOR-CON) 10 MEQ tablet Take 10 mEq by mouth daily.    . rosuvastatin (CRESTOR) 20 MG tablet Take 1 tablet (20 mg total) by mouth daily. 90 tablet 3   No facility-administered medications prior to visit.    PAST MEDICAL HISTORY: Past Medical History:  Diagnosis Date  . Allergy    seasonal  . Depression    2020/04/19-husband passed away  . Hyperlipidemia   . Hypertension   . OSA on CPAP   . Primary snoring 12/02/2014  . Sleep apnea    uses CPAP    PAST SURGICAL HISTORY: Past Surgical History:  Procedure Laterality Date  . CHOLECYSTECTOMY  1995   lap choli  . DIAGNOSTIC LAPAROSCOPY     exploratory-endometriosis  . MASS EXCISION  06/02/2012   Procedure: EXCISION MASS;  Surgeon: Wynonia Sours, MD;  Location: [facility];  Service: Orthopedics;  Laterality: Left;  Excision  mass left thumb  . wisdom teeth      FAMILY HISTORY: Family History  Problem Relation Age of Onset  . Hypertension Father   . Prostate cancer Father   . Lymphoma Father   . Hyperlipidemia Father   . Other Father        pacemaker  . Irregular heart beat Mother   . Hyperlipidemia Mother   . Depression Mother   . Alzheimer's disease Mother   . Dementia Mother   . Heart disease Mother   . Hypertension Sister   . Heart attack Other        Grandfather  . Colon cancer Neg Hx   . Colon polyps Neg Hx   . Esophageal cancer Neg Hx   . Stomach cancer Neg Hx   . Rectal cancer Neg Hx     SOCIAL HISTORY: Social History   Socioeconomic History   . Marital status: Married    Spouse name: Elberta Fortis  . Number of children: 4  . Years of education: Masters  . Highest education level: Not on file  Occupational History  . Occupation: Sales executive: self employeed    Comment: Temporary  Tobacco Use  . Smoking status: Never Smoker  . Smokeless tobacco: Never Used  Vaping Use  . Vaping Use: Never used  Substance and Sexual Activity  . Alcohol use: Not Currently    Comment: occasionally  . Drug use: No  . Sexual activity: Not on file  Other Topics Concern  . Not on file  Social History Narrative   Patient is married Elberta Fortis)   Patient has four children.   Patient has a Scientist, water quality.   Patient drinks one caffeine drink per week.      Social Determinants of Health   Financial Resource Strain: Not on file  Food Insecurity: Not on file  Transportation Needs: Not on file  Physical Activity: Not on file  Stress: Not on file  Social Connections: Not on file  Intimate Partner Violence: Not on file      PHYSICAL EXAM Generalized: Well developed, in no acute distress   Neurological examination  Mentation: Alert oriented to time, place, history taking. Follows all commands speech and language fluent Cranial nerve II-XII:Extraocular movements were full. Facial symmetry noted. uvula tongue midline. Head turning and shoulder shrug  were normal and symmetric. Motor: Good strength throughout subjectively per patient Sensory: Sensory testing is intact to soft touch on all 4 extremities subjectively per patient Coordination: Cerebellar testing reveals good finger-nose-finger  Gait and station: Patient is able to stand from a seated position. gait is normal.  Reflexes: UTA  DIAGNOSTIC DATA (LABS, IMAGING, TESTING) - I reviewed patient records, labs, notes, testing and imaging myself where available.  Lab Results  Component Value Date   HGB 13.8 06/02/2012      Component Value Date/Time   NA 139 01/06/2020  1238   K 3.9 01/06/2020 1238   CL 98 01/06/2020 1238   CO2 28 01/06/2020 1238   GLUCOSE 106 (H) 01/06/2020 1238   GLUCOSE 96 05/28/2012 1030   BUN 14 01/06/2020 1238   CREATININE 0.97 01/06/2020 1238   CALCIUM 9.7 01/06/2020 1238   GFRNONAA 65 01/06/2020 1238   GFRAA 76 01/06/2020 1238    Lab Results  Component Value Date   TSH 2.040 01/06/2020      ASSESSMENT AND PLAN 55 y.o. year old female  has a past medical history of Allergy, Depression, Hyperlipidemia, Hypertension, OSA on  CPAP, Primary snoring (12/02/2014), and Sleep apnea. here with:  OSA on CPAP   Patient will bring her ST card by our office so we can obtain a download.  Once we have this information I will call her with her report.  She is encouraged to continue using her CPAP nightly and greater than 4 hours each night.  Advised if her symptoms worsen or she develops new symptoms she should let us know  Follow-up in 1 year or sooner if needed  I spent 15 minutes of face-to-face and non-face-to-face time with patient.  This included previsit chart review, lab review, study review, order entry, electronic health record documentation, patient education.  Ward Givens, MSN, NP-C 12/20/2020, 11:07 AM [facility] [address] [place],  [address] [phone]

## 2021-01-16 ENCOUNTER — Encounter: Payer: Self-pay | Admitting: Cardiovascular Disease

## 2021-01-26 ENCOUNTER — Ambulatory Visit: Payer: BC Managed Care – PPO | Admitting: Cardiovascular Disease

## 2021-02-09 ENCOUNTER — Ambulatory Visit: Payer: BC Managed Care – PPO | Admitting: Cardiovascular Disease

## 2021-02-20 NOTE — Progress Notes (Signed)
Cardiology Office Note:    Date:  03/01/2021   ID:  ROX MCGRIFF, DOB 1966/03/03, MRN [id number]  PCP:  Ginger Organ., MD  Cardiologist:  Evalina Field, MD  Electrophysiologist:  None   Referring MD: Ginger Organ., MD   Chief Complaint: follow-up of CAD  History of Present Illness:    Marie Rocha is a 55 y.o. 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 Audie Box and presents today for routine follow-up.  Patient was referred to Dr. Audie Box in 10/2019 for further evaluation of chest pain pain and shortness of breath. Echo and coronary CTA were ordered for further evaluation. Echo showed LVEF of 60-65% with normal wall motion and mild LVH. No significant valvular disease. Coronary CTA showed coronary calcium score of 166 (96th percentile for age and sex) and mild non-obstructive CAD (25-49%) in the LAD and RCA as well as distal LAD myocardial bridge. She was started on aspirin and statin. She was last seen by Dr. Audie Box for a virtual visit in 01/2020 at which time she was doing well without any cardiac chest pain.  Patient presents today for follow-up. Patient doing well from a cardiac standpoint. She has had a hard year though - her husband passed away in 19-Apr-2020. Her weight has fluctuated a lot since that time. She is up about 9lbs since last year. However, she recently got a dog which is helping her be more active. In 09/2020, she saw her PCP for chest pain. She states it felt like stinging pain that occurred with activity but actually improved the longer she was active. Upon further questions, it sounds like pain always occurred when she went and tried to clean her late-husbands room/closet. Her PCP did not feel like this was cardiac chest pain (which I agree with) but did prescribe her a medication. She cannot remember what medication was prescribed but  thinks it was for nerves. Asked her to check on this when she gets home and call us with an update. No shortness of breath. No trouble breathing at night as long as she wears her CPAP. No orthopnea or PND. She has some chronic lower extremity edema but this is stable. No palpitations. No significant lightheadedness/dizziness. No near syncope/syncope.   Past Medical History:  Diagnosis Date  . Allergy    seasonal  . Depression    Apr 19, 2020-husband passed away  . Hyperlipidemia   . Hypertension   . OSA on CPAP   . Primary snoring 12/02/2014  . Sleep apnea    uses CPAP    Past Surgical History:  Procedure Laterality Date  . CHOLECYSTECTOMY  1995   lap choli  . DIAGNOSTIC LAPAROSCOPY     exploratory-endometriosis  . MASS EXCISION  06/02/2012   Procedure: EXCISION MASS;  Surgeon: Wynonia Sours, MD;  Location: [facility];  Service: Orthopedics;  Laterality: Left;  Excision mass left thumb  . wisdom teeth      Current Medications: Current Meds  Medication Sig  . amLODipine (NORVASC) 5 MG tablet Take 5 mg by mouth daily. Reported on 10/19/2015  . ASPIRIN 81 PO Take 1 tablet by mouth daily.  . calcium carbonate 200 MG capsule Take by mouth daily.  . Cholecalciferol (VITAMIN D) 125 MCG (5000 UT) CAPS Take by mouth daily.  . fluticasone (FLONASE) 50 MCG/ACT nasal spray Place 1 spray into  both nostrils daily as needed.   Marland Kitchen KLOR-CON M20 20 MEQ tablet Take 20 mEq by mouth daily.  Marland Kitchen olmesartan-hydrochlorothiazide (BENICAR HCT) 40-25 MG tablet daily.     Allergies:   Patient has no known allergies.   Social History   Socioeconomic History  . Marital status: Married    Spouse name: Elberta Fortis  . Number of children: 4  . Years of education: Masters  . Highest education level: Not on file  Occupational History  . Occupation: Sales executive: self employeed    Comment: Temporary  Tobacco Use  . Smoking status: Never Smoker  . Smokeless tobacco: Never Used   Vaping Use  . Vaping Use: Never used  Substance and Sexual Activity  . Alcohol use: Not Currently    Comment: occasionally  . Drug use: No  . Sexual activity: Not on file  Other Topics Concern  . Not on file  Social History Narrative   Patient is married Elberta Fortis)   Patient has four children.   Patient has a Scientist, water quality.   Patient drinks one caffeine drink per week.      Social Determinants of Health   Financial Resource Strain: Not on file  Food Insecurity: Not on file  Transportation Needs: Not on file  Physical Activity: Not on file  Stress: Not on file  Social Connections: Not on file     Family History: The patient's family history includes Alzheimer's disease in her mother; Dementia in her mother; Depression in her mother; Heart attack in an other family member; Heart disease in her mother; Hyperlipidemia in her father and mother; Hypertension in her father and sister; Irregular heart beat in her mother; Lymphoma in her father; Other in her father; Prostate cancer in her father. There is no history of Colon cancer, Colon polyps, Esophageal cancer, Stomach cancer, or Rectal cancer.  ROS:   Please see the history of present illness.     EKGs/Labs/Other Studies Reviewed:    The following studies were reviewed today:  Echocardiogram 11/24/2019: Impressions: 1. Left ventricular ejection fraction, by visual estimation, is 60 to  65%. The left ventricle has normal function. There is mildly increased  left ventricular hypertrophy.  2. The left ventricle has no regional wall motion abnormalities.  3. Global right ventricle has normal systolic function.The right  ventricular size is normal. No increase in right ventricular wall  thickness.  4. Left atrial size was mildly dilated.  5. Right atrial size was normal.  6. The mitral valve is normal in structure. Trivial mitral valve  regurgitation. No evidence of mitral stenosis.  7. The tricuspid valve is normal in  structure.  8. The tricuspid valve is normal in structure. Tricuspid valve  regurgitation is not demonstrated.  9. The aortic valve is normal in structure. Aortic valve regurgitation is  not visualized. No evidence of aortic valve sclerosis or stenosis.  10. The pulmonic valve was normal in structure. Pulmonic valve  regurgitation is not visualized.  11. Normal pulmonary artery systolic pressure.  12. The inferior vena cava is normal in size with greater than 50%  respiratory variability, suggesting right atrial pressure of 3 mmHg.  _______________  Coronary CTA 01/07/2020: Impressions: 1. Coronary calcium score of 166. This was 50 percentile for age and sex matched control. 2. Normal coronary origin with right dominance. 3. 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 4. Distal LAD myocardial bridge.  Recommendations: 1. Mild non-obstructive CAD (25-49%). Consider non-atherosclerotic causes of chest pain.  Consider preventive therapy and risk factor modification.  EKG:  EKG ordered today. EKG personally reviewed and demonstrates normal sinus rhythm, rate 63 bpm, with PAC and RBBB but no acute ST/T changes. Superior right axis deviation. Normal PR and QRS intervals. QTc 456 ms. No significant changes from prior EKG.  Recent Labs: No results found for requested labs within last 8760 hours.  Recent Lipid Panel No results found for: CHOL, TRIG, HDL, CHOLHDL, VLDL, LDLCALC, LDLDIRECT  Physical Exam:    Vital Signs: BP (!) 142/84   Pulse 63   Ht 5\' 7"  (1.702 m)   Wt 240 lb (108.9 kg)   SpO2 97%   BMI 37.59 kg/m     Wt Readings from Last 3 Encounters:  03/01/21 240 lb (108.9 kg)  05/23/20 (!) 231 lb (104.8 kg)  05/10/20 231 lb (104.8 kg)     General: 55 y.o. obese African-American female in no acute distress. HEENT: Normocephalic and atraumatic. Sclera clear.  Neck: Supple. No carotid bruits. No JVD. Heart: RRR. Distinct S1 and S2. No murmurs, gallops, or rubs.  Radial pulses 2+ and equal bilaterally. Lungs: No increased work of breathing. Clear to ausculation bilaterally. No wheezes, rhonchi, or rales.  Abdomen: Soft, non-distended, and non-tender to palpation. Extremities: Mild ankle edema bilaterally. Skin: Warm and dry. Neuro: Alert and oriented x3. No focal deficits. Psych: Normal affect. Responds appropriately.  Assessment:    1. Coronary artery disease involving native coronary artery of native heart without angina pectoris   2. Primary hypertension   3. Hyperlipidemia, unspecified hyperlipidemia type   4. Bilateral lower extremity edema   5. Obstructive sleep apnea   6. Obesity (BMI 30-39.9)     Plan:    Non-Obstructive CAD - Coronary CTA in 12/2019 showed coronary calcium score of 166 (96th percentile for age and sex) and mild non-obstructive CAD (25-49%) in the LAD and RCA as well as distal LAD myocardial bridge. - No angina.  - Continue aspirin and high-intensity statin.   Hypertension - BP mildly elevated in office at 142/84. - Continue Amlodipine 5mg  daily.  - Previously on Olmesartan 20mg  daily. Since last visit, has been switched to Olmesartan-HCTZ combo pill. She is unsure of exact dose but per med reconciliation looks like she is on 40-25mg  daily. Continue this for now. Patient will confirm what dose she is on when she gets home and let us know.  - Asked patient to keep BP and HR log for 2-3 weeks and then send this to [place]. If consistently above goal of <130/80, may need to increase HCTZ.  - Recommended increasing physical activity to 150 minutes of exercise per week.  Hyperlipidemia - Lipid panel from 01/2020: Total Cholesterol 115, Triglycerides 59, HDL 37, LDL 66. - Continue Crestor 20mg  daily. - Patient is planning on scheduling routine appointment with PCP soon and will have labs checked there. Asked for her to have lab results faxed to [place].  Chronic Lower Extremity Edema - Patient has mild lower extremity edema which  is chronic and not overly bothersome. Well controlled with HCTZ. No other signs of CHF. - Amlodipine may be contributing. - Patient to let us know if this worsens or becomes bothersome to her. Can consider stopping Amlodipine if this happens.   Obstructive Sleep Apnea - Continue CPAP.  Obesity  - BMI 37.59. She is up about 9 lbs since last year.  - Recommend increasing physical activity and getting at least 150 minutes of physical activity per week. Patient recently got  a dog which has helped her be more active.   Disposition: Follow up in 1 year.   Medication Adjustments/Labs and Tests Ordered: Current medicines are reviewed at length with the patient today.  Concerns regarding medicines are outlined above.  Orders Placed This Encounter  Procedures  . EKG 12-Lead   No orders of the defined types were placed in this encounter.   Patient Instructions  Medication Instructions:  Your physician recommends that you continue on your current medications as directed. Please refer to the Current Medication list given to you today.  *If you need a refill on your cardiac medications before your next appointment, please call your pharmacy*   Lab Work: None ordered, please fax this office the results of the lipid panel drawn by PCP.   Testing/Procedures: None ordered.   Follow-Up: At [facility], you and your health needs are our priority.  As part of our continuing mission to provide you with exceptional heart care, we have created designated Provider Care Teams.  These Care Teams include your primary Cardiologist (physician) and Advanced Practice Providers (APPs -  Physician Assistants and Nurse Practitioners) who all work together to provide you with the care you need, when you need it.  We recommend signing up for the patient portal called "MyChart".  Sign up information is provided on this After Visit Summary.  MyChart is used to connect with patients for Virtual Visits  (Telemedicine).  Patients are able to view lab/test results, encounter notes, upcoming appointments, etc.  Non-urgent messages can be sent to your provider as well.   To learn more about what you can do with MyChart, go to [email or web address].    Your next appointment:   12 month(s)  The format for your next appointment:   In Person  Provider:   You may see Dr. Farris Has or Sande Rives for follow up.    Other Instructions Keep a daily log (1 or 2 times a day) of your heart rate and blood pressure for 2-3 weeks and send to office via [facility] or phone.      Signed, Darreld Mclean, PA-C  03/01/2021 4:02 PM    [facility]

## 2021-03-01 ENCOUNTER — Ambulatory Visit (INDEPENDENT_AMBULATORY_CARE_PROVIDER_SITE_OTHER): Payer: 59 | Admitting: Student

## 2021-03-01 ENCOUNTER — Other Ambulatory Visit: Payer: Self-pay

## 2021-03-01 ENCOUNTER — Encounter: Payer: Self-pay | Admitting: Student

## 2021-03-01 VITALS — BP 142/84 | HR 63 | Ht 67.0 in | Wt 240.0 lb

## 2021-03-01 DIAGNOSIS — I10 Essential (primary) hypertension: Secondary | ICD-10-CM

## 2021-03-01 DIAGNOSIS — I2510 Atherosclerotic heart disease of native coronary artery without angina pectoris: Secondary | ICD-10-CM | POA: Diagnosis not present

## 2021-03-01 DIAGNOSIS — E785 Hyperlipidemia, unspecified: Secondary | ICD-10-CM

## 2021-03-01 DIAGNOSIS — E669 Obesity, unspecified: Secondary | ICD-10-CM

## 2021-03-01 DIAGNOSIS — R600 Localized edema: Secondary | ICD-10-CM | POA: Diagnosis not present

## 2021-03-01 DIAGNOSIS — G4733 Obstructive sleep apnea (adult) (pediatric): Secondary | ICD-10-CM

## 2021-03-01 NOTE — Patient Instructions (Signed)
Medication Instructions:  Your physician recommends that you continue on your current medications as directed. Please refer to the Current Medication list given to you today.  *If you need a refill on your cardiac medications before your next appointment, please call your pharmacy*   Lab Work: None ordered, please fax this office the results of the lipid panel drawn by PCP.   Testing/Procedures: None ordered.   Follow-Up: At [facility], you and your health needs are our priority.  As part of our continuing mission to provide you with exceptional heart care, we have created designated Provider Care Teams.  These Care Teams include your primary Cardiologist (physician) and Advanced Practice Providers (APPs -  Physician Assistants and Nurse Practitioners) who all work together to provide you with the care you need, when you need it.  We recommend signing up for the patient portal called "MyChart".  Sign up information is provided on this After Visit Summary.  MyChart is used to connect with patients for Virtual Visits (Telemedicine).  Patients are able to view lab/test results, encounter notes, upcoming appointments, etc.  Non-urgent messages can be sent to your provider as well.   To learn more about what you can do with MyChart, go to [email or web address].    Your next appointment:   12 month(s)  The format for your next appointment:   In Person  Provider:   You may see Dr. Farris Has or Sande Rives for follow up.    Other Instructions Keep a daily log (1 or 2 times a day) of your heart rate and blood pressure for 2-3 weeks and send to office via [facility] or phone.

## 2021-03-06 ENCOUNTER — Telehealth: Payer: Self-pay

## 2021-03-06 NOTE — Telephone Encounter (Signed)
Pt came by the office and had cpap DL for review. Pt recent had an appointment with MM, NP and was asked to bring machine by. Below is the technical data from the machine.   I discussed the report with MM,NP and she advised all look good just to advise pt to use her machine at least 4 hours every night.   Pt was advised of this recommendation via mychart.

## 2021-06-03 NOTE — Telephone Encounter (Signed)
Error encounter. 

## 2021-11-30 ENCOUNTER — Telehealth: Payer: Self-pay | Admitting: Cardiovascular Disease

## 2021-11-30 MED ORDER — ROSUVASTATIN CALCIUM 20 MG PO TABS: 20.0000 mg | ORAL_TABLET | Freq: Every day | ORAL | 3 refills | Status: AC

## 2021-11-30 NOTE — Addendum Note (Signed)
Addended by: Alvin Critchley on: 11/30/2021 01:35 PM   Modules accepted: Orders

## 2021-11-30 NOTE — Telephone Encounter (Signed)
°*  STAT* If patient is at the pharmacy, call can be transferred to refill team.   1. Which medications need to be refilled? (please list name of each medication and dose if known) rosuvastatin (CRESTOR) 20 MG tablet (Expired)  2. Which pharmacy/location (including street and city if local pharmacy) is medication to be sent to? [facility] [address] [facility] [place]  3. Do they need a 30 day or 90 day supply? [place]

## 2021-11-30 NOTE — Telephone Encounter (Signed)
Refill sent to the pharmacy 

## 2021-12-10 ENCOUNTER — Ambulatory Visit: Payer: Self-pay | Admitting: Adult Health
